# Patient Record
Sex: Female | Born: 1940 | Race: White | Hispanic: No | Smoking: Never smoker
Health system: Southern US, Community
[De-identification: ages and names within clinical notes are randomized; demographics above are authoritative.]

## PROBLEM LIST (undated history)

## (undated) DIAGNOSIS — C44509 Unspecified malignant neoplasm of skin of other part of trunk: Secondary | ICD-10-CM

## (undated) DIAGNOSIS — K635 Polyp of colon: Secondary | ICD-10-CM

## (undated) DIAGNOSIS — J986 Disorders of diaphragm: Secondary | ICD-10-CM

## (undated) DIAGNOSIS — J189 Pneumonia, unspecified organism: Secondary | ICD-10-CM

## (undated) DIAGNOSIS — E042 Nontoxic multinodular goiter: Secondary | ICD-10-CM

## (undated) DIAGNOSIS — K5790 Diverticulosis of intestine, part unspecified, without perforation or abscess without bleeding: Secondary | ICD-10-CM

## (undated) DIAGNOSIS — E785 Hyperlipidemia, unspecified: Secondary | ICD-10-CM

## (undated) DIAGNOSIS — I350 Nonrheumatic aortic (valve) stenosis: Secondary | ICD-10-CM

## (undated) DIAGNOSIS — N189 Chronic kidney disease, unspecified: Secondary | ICD-10-CM

## (undated) DIAGNOSIS — M6700 Short Achilles tendon (acquired), unspecified ankle: Secondary | ICD-10-CM

## (undated) DIAGNOSIS — K222 Esophageal obstruction: Secondary | ICD-10-CM

## (undated) DIAGNOSIS — I499 Cardiac arrhythmia, unspecified: Secondary | ICD-10-CM

## (undated) DIAGNOSIS — R002 Palpitations: Secondary | ICD-10-CM

## (undated) DIAGNOSIS — K219 Gastro-esophageal reflux disease without esophagitis: Secondary | ICD-10-CM

## (undated) DIAGNOSIS — M1990 Unspecified osteoarthritis, unspecified site: Secondary | ICD-10-CM

## (undated) DIAGNOSIS — K449 Diaphragmatic hernia without obstruction or gangrene: Secondary | ICD-10-CM

## (undated) DIAGNOSIS — I10 Essential (primary) hypertension: Secondary | ICD-10-CM

## (undated) DIAGNOSIS — K649 Unspecified hemorrhoids: Secondary | ICD-10-CM

## (undated) DIAGNOSIS — R0600 Dyspnea, unspecified: Secondary | ICD-10-CM

## (undated) DIAGNOSIS — R0609 Other forms of dyspnea: Secondary | ICD-10-CM

## (undated) HISTORY — PX: TOE AMPUTATION: SHX809

## (undated) HISTORY — PX: EYE SURGERY: SHX253

## (undated) HISTORY — DX: Unspecified hemorrhoids: K64.9

## (undated) HISTORY — DX: Other forms of dyspnea: R06.09

## (undated) HISTORY — PX: OTHER SURGICAL HISTORY: SHX169

## (undated) HISTORY — PX: TOTAL KNEE ARTHROPLASTY: SHX125

## (undated) HISTORY — PX: JOINT REPLACEMENT: SHX530

## (undated) HISTORY — PX: MASS EXCISION: SHX2000

## (undated) HISTORY — DX: Disorders of diaphragm: J98.6

## (undated) HISTORY — PX: BIOPSY THYROID: PRO38

## (undated) HISTORY — PX: BILATERAL OOPHORECTOMY: SHX1221

## (undated) HISTORY — DX: Diaphragmatic hernia without obstruction or gangrene: K44.9

## (undated) HISTORY — DX: Palpitations: R00.2

## (undated) HISTORY — DX: Morbid (severe) obesity due to excess calories: E66.01

## (undated) HISTORY — DX: Unspecified malignant neoplasm of skin of other part of trunk: C44.509

## (undated) HISTORY — DX: Gastro-esophageal reflux disease without esophagitis: K21.9

## (undated) HISTORY — PX: APPENDECTOMY: SHX54

## (undated) HISTORY — DX: Dyspnea, unspecified: R06.00

## (undated) HISTORY — DX: Unspecified osteoarthritis, unspecified site: M19.90

## (undated) HISTORY — PX: COLONOSCOPY: SHX174

## (undated) HISTORY — DX: Nonrheumatic aortic (valve) stenosis: I35.0

## (undated) HISTORY — DX: Short Achilles tendon (acquired), unspecified ankle: M67.00

## (undated) HISTORY — PX: FINGER SURGERY: SHX640

## (undated) HISTORY — DX: Essential (primary) hypertension: I10

## (undated) HISTORY — DX: Esophageal obstruction: K22.2

## (undated) HISTORY — DX: Hyperlipidemia, unspecified: E78.5

## (undated) HISTORY — PX: DILATION AND CURETTAGE OF UTERUS: SHX78

---

## 1999-01-03 ENCOUNTER — Ambulatory Visit (HOSPITAL_BASED_OUTPATIENT_CLINIC_OR_DEPARTMENT_OTHER): Admission: RE | Admit: 1999-01-03 | Discharge: 1999-01-03 | Payer: Self-pay | Admitting: Orthopedic Surgery

## 2000-05-12 ENCOUNTER — Other Ambulatory Visit: Admission: RE | Admit: 2000-05-12 | Discharge: 2000-05-12 | Payer: Self-pay | Admitting: Obstetrics and Gynecology

## 2000-05-12 ENCOUNTER — Encounter (INDEPENDENT_AMBULATORY_CARE_PROVIDER_SITE_OTHER): Payer: Self-pay | Admitting: Specialist

## 2000-06-22 ENCOUNTER — Encounter (INDEPENDENT_AMBULATORY_CARE_PROVIDER_SITE_OTHER): Payer: Self-pay | Admitting: Specialist

## 2000-06-22 ENCOUNTER — Ambulatory Visit (HOSPITAL_COMMUNITY): Admission: RE | Admit: 2000-06-22 | Discharge: 2000-06-22 | Payer: Self-pay | Admitting: Obstetrics and Gynecology

## 2000-06-24 ENCOUNTER — Inpatient Hospital Stay (HOSPITAL_COMMUNITY): Admission: AD | Admit: 2000-06-24 | Discharge: 2000-06-24 | Payer: Self-pay | Admitting: Obstetrics and Gynecology

## 2005-07-21 ENCOUNTER — Ambulatory Visit (HOSPITAL_COMMUNITY): Admission: RE | Admit: 2005-07-21 | Discharge: 2005-07-21 | Payer: Self-pay | Admitting: Internal Medicine

## 2005-07-22 ENCOUNTER — Inpatient Hospital Stay (HOSPITAL_COMMUNITY): Admission: RE | Admit: 2005-07-22 | Discharge: 2005-07-28 | Payer: Self-pay | Admitting: Specialist

## 2005-11-11 ENCOUNTER — Inpatient Hospital Stay (HOSPITAL_COMMUNITY): Admission: RE | Admit: 2005-11-11 | Discharge: 2005-11-15 | Payer: Self-pay | Admitting: Specialist

## 2006-02-02 ENCOUNTER — Ambulatory Visit: Payer: Self-pay | Admitting: Internal Medicine

## 2006-02-05 ENCOUNTER — Ambulatory Visit: Payer: Self-pay | Admitting: Internal Medicine

## 2006-02-05 ENCOUNTER — Encounter (INDEPENDENT_AMBULATORY_CARE_PROVIDER_SITE_OTHER): Payer: Self-pay | Admitting: Specialist

## 2007-09-23 ENCOUNTER — Encounter (INDEPENDENT_AMBULATORY_CARE_PROVIDER_SITE_OTHER): Payer: Self-pay | Admitting: Interventional Radiology

## 2007-09-23 ENCOUNTER — Encounter: Admission: RE | Admit: 2007-09-23 | Discharge: 2007-09-23 | Payer: Self-pay | Admitting: Internal Medicine

## 2007-09-23 ENCOUNTER — Other Ambulatory Visit: Admission: RE | Admit: 2007-09-23 | Discharge: 2007-09-23 | Payer: Self-pay | Admitting: Interventional Radiology

## 2007-10-18 ENCOUNTER — Ambulatory Visit: Payer: Self-pay | Admitting: Vascular Surgery

## 2007-10-20 ENCOUNTER — Encounter (HOSPITAL_BASED_OUTPATIENT_CLINIC_OR_DEPARTMENT_OTHER): Admission: RE | Admit: 2007-10-20 | Discharge: 2008-01-18 | Payer: Self-pay | Admitting: Surgery

## 2008-01-20 ENCOUNTER — Encounter (HOSPITAL_BASED_OUTPATIENT_CLINIC_OR_DEPARTMENT_OTHER): Admission: RE | Admit: 2008-01-20 | Discharge: 2008-02-07 | Payer: Self-pay | Admitting: Surgery

## 2008-03-13 ENCOUNTER — Inpatient Hospital Stay (HOSPITAL_COMMUNITY): Admission: AD | Admit: 2008-03-13 | Discharge: 2008-03-15 | Payer: Self-pay | Admitting: Orthopedic Surgery

## 2009-04-13 IMAGING — US US SOFT TISSUE HEAD/NECK
1 series · 13 of 25 positions shown · non-contrast
Comparison: NONE

CLINICAL DATA: Follow up nodules. Prior study 09/07/06. 

THYROID ULTRASOUND

[Series 1: us thyroid · 0.06mm/px · 13 of 34 slices shown]
[im 1/34]
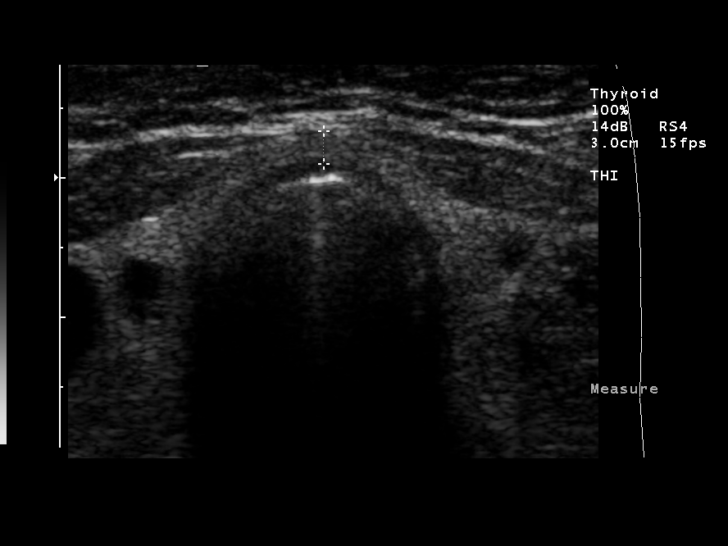
[im 3/34]
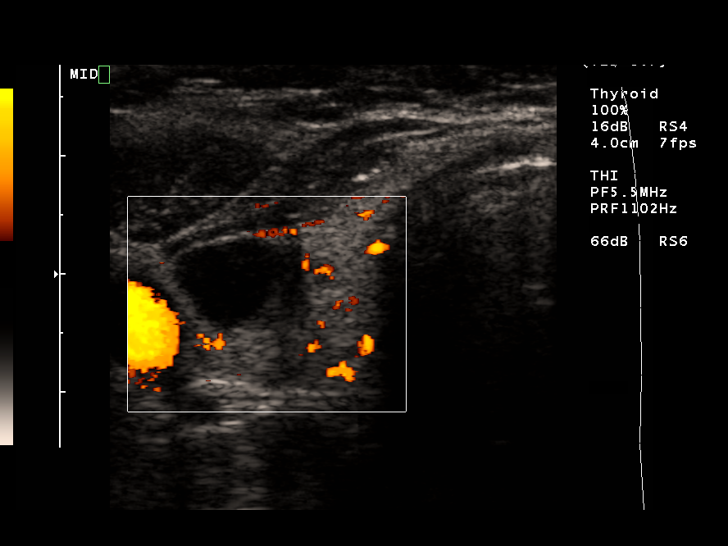
[im 6/34]
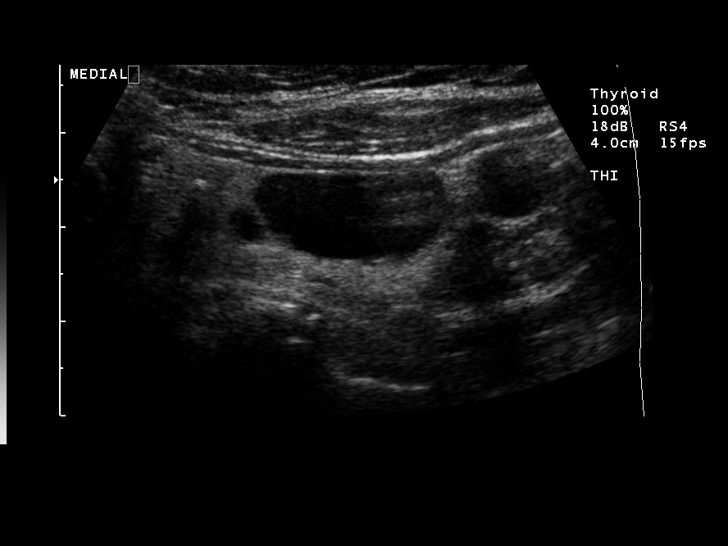
[im 9/34]
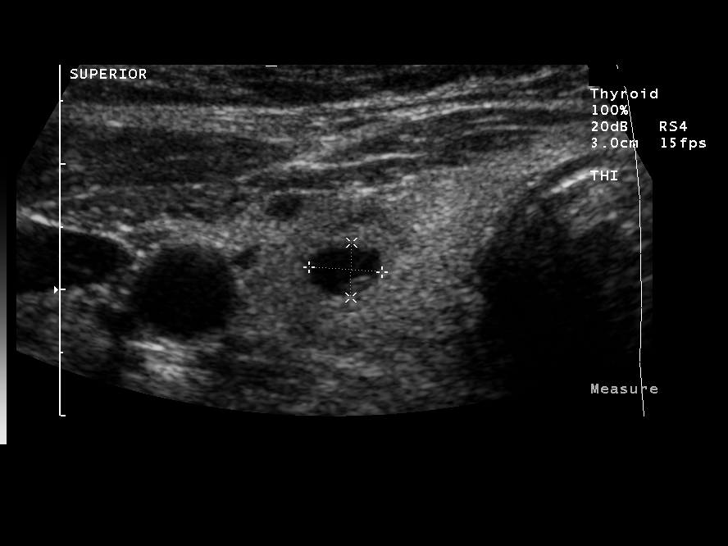
[im 12/34]
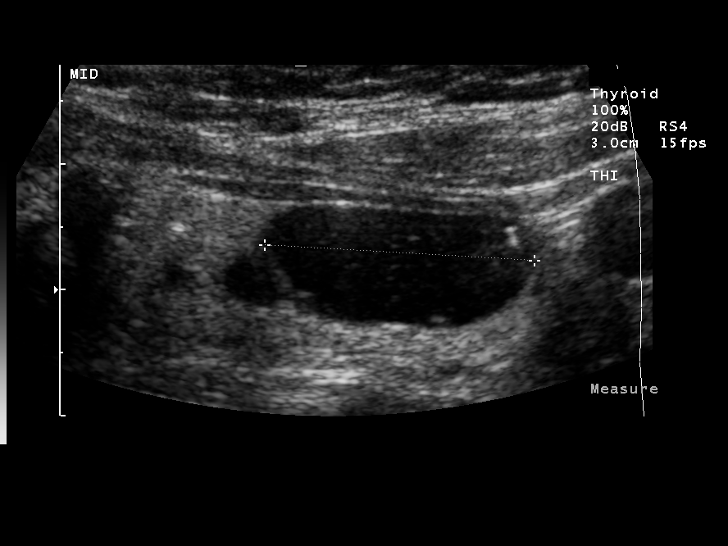
[im 14/34]
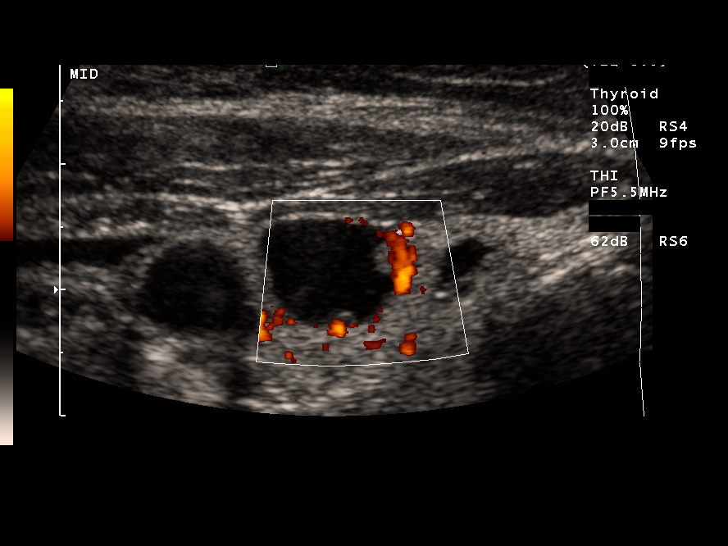
[im 17/34]
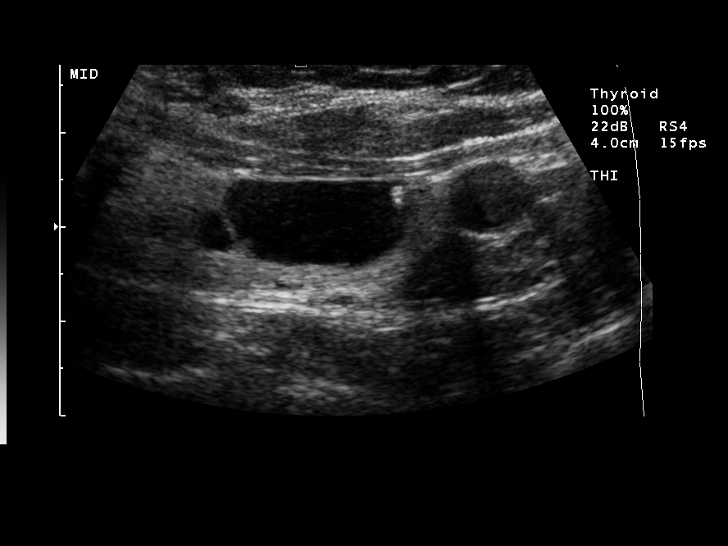
[im 20/34]
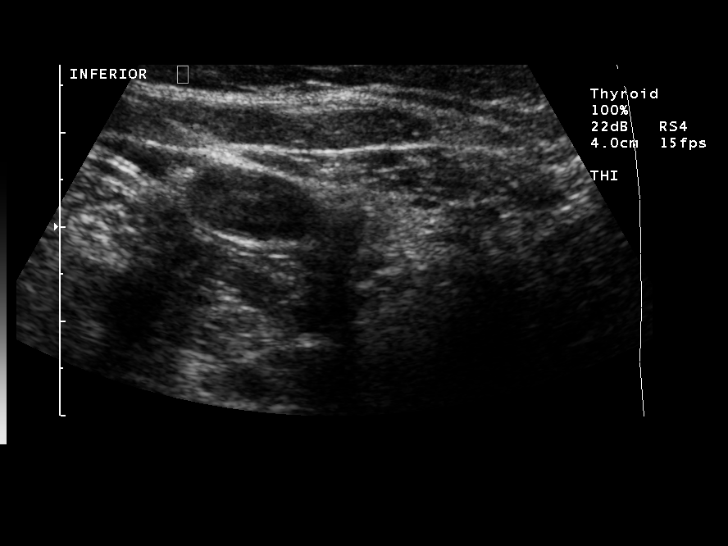
[im 23/34]
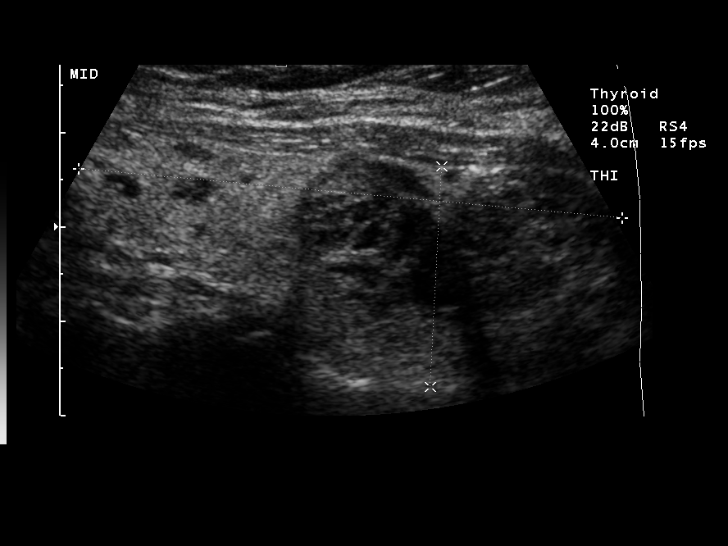
[im 25/34]
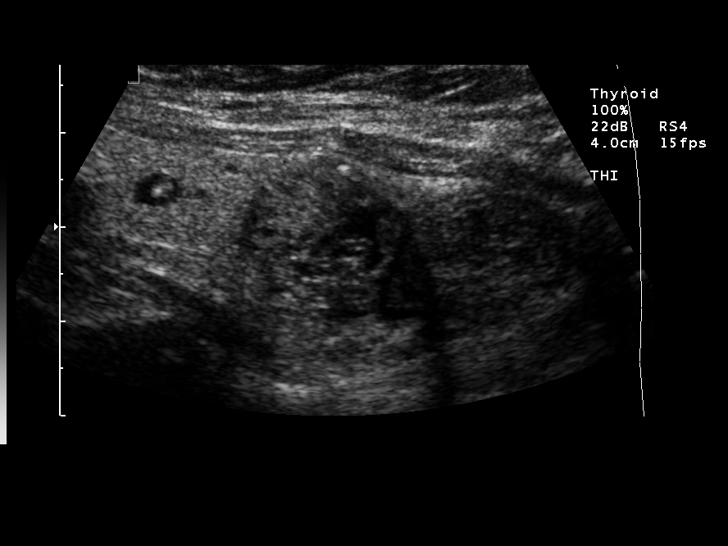
[im 28/34]
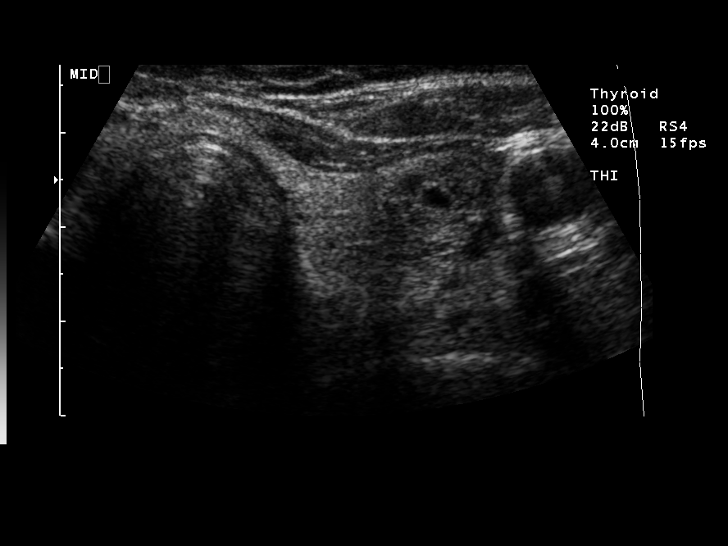
[im 31/34]
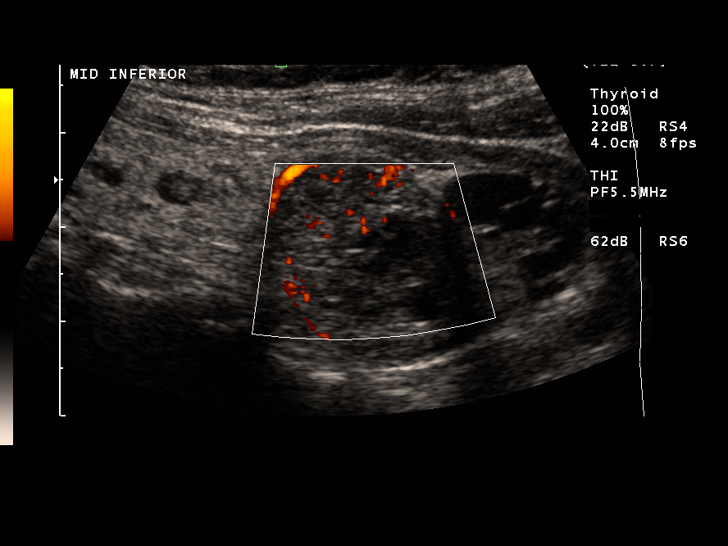
[im 34/34]
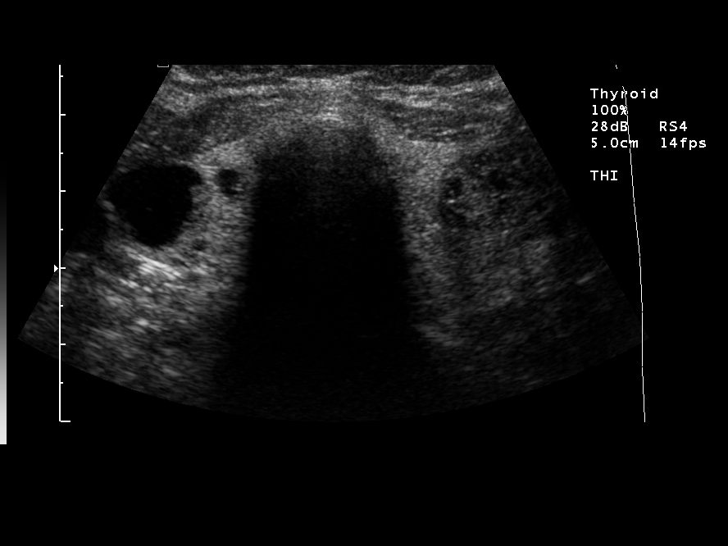

[13 of 25 positions shown; findings below may reference images not displayed]

FINDINGS RIGHT LOBE: 5.2 x 1.5 x 2.2 cm LEFT LOBE: 5.8 x 2.3 x 
1.9 cm ISTHMUS: 0.2 cm The thyroid gland is symmetrically 
enlarged. Examination of the right lobe demonstrates a 
predominantly cystic nodule measuring 2.1 x 1.1 x 1.5 cm. This is 
stable in size. Two small soft tissue components are now noted 
within this cyst, each measuring 0.3 cm. Focal rounded 
calcifications in the periphery suggest colloid cysts. 
Heterogeneous hypoechoic nodules noted in the upper lobe measure 
0.7 cm and 0.6 cm. These have not changed significantly. Located 
in the lower lobe, there is a small cyst measuring 0.8 cm. This is 
also stable. Evaluation of the left lobe again demonstrates a 
solid nodule at the junction of the mid and inferior portions of 
the lobe, measuring 2.7 x 2.3 x 2.1 cm. This is heterogeneous in 
appearance. Measurement is slightly greater when compared with the 
prior study; however, echo appearances are unchanged. An adjacent 
smaller, similar-appearing nodule is also noted measuring 2.2 x 
1.7 x 2.0 cm. This was present on the prior study and does not 
appear significantly changed. A 0.6-cm heterogeneous nodule is 
noted in the upper left lobe.
IMPRESSION: Bilateral thyroid nodules. The predominantly cystic 
structure in the right lobe now contains two small soft tissue 
components. The solid nodule on the left, although appearing 
similar in echo pattern, has increased slightly in size. I 
recommend correlation with any previous biopsy results. I would 
give consideration to biopsy of the two dominant nodules in each 
lobe versus ultrasound re-evaluation in 6 months. Torrence Tewari 
Wniu, M.D. electronically reviewed on 09/10/2007 Dict Date: 
09/09/2007  Tran Date: 09/10/2007 CAV  [REDACTED]

## 2010-01-15 ENCOUNTER — Encounter: Admission: RE | Admit: 2010-01-15 | Discharge: 2010-01-15 | Payer: Self-pay | Admitting: Internal Medicine

## 2010-12-26 ENCOUNTER — Encounter: Payer: Self-pay | Admitting: Cardiology

## 2011-01-01 ENCOUNTER — Ambulatory Visit: Payer: Self-pay | Admitting: Cardiology

## 2011-01-25 ENCOUNTER — Ambulatory Visit (HOSPITAL_COMMUNITY)
Admission: RE | Admit: 2011-01-25 | Discharge: 2011-01-25 | Disposition: A | Discharge status: Home / Self Care | Payer: Medicare Other | Source: Ambulatory Visit | Attending: Orthopedic Surgery | Admitting: Orthopedic Surgery

## 2011-01-25 DIAGNOSIS — Z01818 Encounter for other preprocedural examination: Secondary | ICD-10-CM | POA: Insufficient documentation

## 2011-01-25 DIAGNOSIS — M869 Osteomyelitis, unspecified: Secondary | ICD-10-CM | POA: Insufficient documentation

## 2011-01-25 DIAGNOSIS — Z01810 Encounter for preprocedural cardiovascular examination: Secondary | ICD-10-CM | POA: Insufficient documentation

## 2011-01-25 DIAGNOSIS — G609 Hereditary and idiopathic neuropathy, unspecified: Secondary | ICD-10-CM | POA: Insufficient documentation

## 2011-01-25 DIAGNOSIS — E119 Type 2 diabetes mellitus without complications: Secondary | ICD-10-CM | POA: Insufficient documentation

## 2011-01-25 DIAGNOSIS — Z01812 Encounter for preprocedural laboratory examination: Secondary | ICD-10-CM | POA: Insufficient documentation

## 2011-01-25 DIAGNOSIS — R9431 Abnormal electrocardiogram [ECG] [EKG]: Secondary | ICD-10-CM | POA: Insufficient documentation

## 2011-01-25 LAB — CBC
HCT: 39.6 % (ref 36.0–46.0)
Hemoglobin: 12.7 g/dL (ref 12.0–15.0)
MCH: 26.7 pg (ref 26.0–34.0)
MCHC: 32.1 g/dL (ref 30.0–36.0)
MCV: 83.4 fL (ref 78.0–100.0)
RBC: 4.75 MIL/uL (ref 3.87–5.11)
RDW: 14.1 % (ref 11.5–15.5)
WBC: 5 10*3/uL (ref 4.0–10.5)

## 2011-01-25 LAB — BASIC METABOLIC PANEL
BUN: 18 mg/dL (ref 6–23)
CO2: 31 mEq/L (ref 19–32)
Calcium: 9.5 mg/dL (ref 8.4–10.5)
Chloride: 103 mEq/L (ref 96–112)
Creatinine, Ser: 0.85 mg/dL (ref 0.4–1.2)
GFR calc Af Amer: >60 mL/min (ref >60)
Glucose, Bld: 130 mg/dL — ABNORMAL HIGH (ref 70–99)
Potassium: 3.9 mEq/L (ref 3.5–5.1)

## 2011-01-25 LAB — SURGICAL PCR SCREEN: Staphylococcus aureus: NEGATIVE

## 2011-01-25 LAB — GLUCOSE, CAPILLARY: Glucose-Capillary: 143 mg/dL — ABNORMAL HIGH (ref 70–99)

## 2011-02-09 NOTE — Op Note (Signed)
  NAMEGINNIFER, CREELMAN                ACCOUNT NO.:  [id number]  MEDICAL RECORD NO.:  [id number]           PATIENT TYPE:  LOCATION:                                 FACILITY:  PHYSICIAN:  Leonides Grills, M.D.     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  OPERATION:  Left 3rd toe amputation through MTP joint.  ANESTHESIA:  General.  SURGEON:  Leonides Grills, M.D.  ASSISTANT:  None.  ESTIMATED BLOOD LOSS:  Minimal.  TOURNIQUET TIME:  None.  COMPLICATIONS:  None.  DISPOSITION:  Stable to PR.  INDICATIONS:  This is a 70 year old female with diabetes mellitus and peripheral neuropathy.  She has had a redness and swelling involving her left great toe.  X-rays showed osteomyelitis of the middle phalanx.  She was consented for the above procedure.  All risks of infection, vessel injury, proximal amputation, possibility of deformity of her lesser toes, wound healing problems, DVT, PE were all explained.  Questions were encouraged and answered.  OPERATION:  The patient was brought to the operating room, placed in the supine position.  After adequate general endotracheal anesthesia was administered as well as vancomycin 500 mg IV piggyback.  The left lower extremity was then prepped and draped in the sterile manner.  No tourniquet was used.  A racquet-shaped incision based dorsally was then made.  Dissection was carried down directly to bone as a full-thickness flap.  Toe was then disarticulated at the MTP joint.  Area was copiously irrigated with normal saline.  Hemostasis was obtained.  Subcu was closed with 3-0 Vicryl.  Skin was closed with 4-0 nylon.  Sterile dressing was applied. Hard sole shoes applied.  The patient was stable to PR.     Leonides Grills, M.D.     PB/MEDQ  D:   01/25/2011  T:  01/26/2011  Job:  [id number]  Electronically Signed by Leonides Grills M.D. on 02/09/2011 09:20:27 AM

## 2011-02-18 NOTE — Assessment & Plan Note (Signed)
[facility]   NAME:  [name], [name]                ACCOUNT NO.:  [id number]   MEDICAL RECORD NO.:  [id number]           DATE OF BIRTH:   PHYSICIAN:  Theresia Majors. Tanda Rockers, M.D. VISIT DATE:  12/23/2007                                   OFFICE VISIT   SUBJECTIVE:  [name] is a 70 year old lady who we are following for a  Wagner 2 diabetic foot ulcer involving the left 3rd toe.  We have  treated the patient with Iodoform gel applied for 3-4 days.  She has had  her custom orthotics adjusted in the interim.  She continues to wear the  white sock as a dressing.  There has been no drainage, fever, or  malodor.   OBJECTIVE:  VITAL SIGNS:  Blood pressure 136/76, respirations 16, pulse  rate 69, temperature 98.3.  Inspection of the left 3rd digit shows that there is no contraction of  the wound.  A halo of callous was excised without difficulty.  There was  no bleeding stimulated.  The foot is warm, capillary refill was brisk.   ASSESSMENT:  Clinical improvement of Wagner grade 2 diabetic foot ulcer.   PLAN:  We will continue the Iodosorb gel and custom orthotics.  We will  re-evaluate her in 2 weeks with anticipation that this wound should be  healed.      Harold A. Tanda Rockers, M.D.  Electronically Signed     HAN/MEDQ  D:  12/23/2007  T:  12/23/2007  Job:  [id number]

## 2011-02-18 NOTE — Assessment & Plan Note (Signed)
[facility]   NAME:  [name], [name]                ACCOUNT NO.:  [id number]   MEDICAL RECORD NO.:  [id number]      DATE OF BIRTH:  Oct 29, 1940   PHYSICIAN:  Theresia Majors. Tanda Rockers, M.D. VISIT DATE:  12/09/2007                                   OFFICE VISIT   SUBJECTIVE:  [name] is a 70 year old lady whom we have followed for  Wagner 2 diabetic foot ulcer involving the third toe on the left foot.  In the interim, we have treated her with topical Iodosorb and  continuation of her offloading orthotic insert.  There has been no  excessive drainage, malodor, pain, or fever.   OBJECTIVE:  Blood pressure is 158/85, respirations 16, pulse rate 65,  temperature is 98.  Inspection of the lower extremity shows trace edema.  There are palpable  pulses bilaterally.  On the left third toe, there is inssipated  Iodosorb, which was removed.  There is a thick callus with subdermal  hemorrhage which was excised without difficulty.  The cornified  epithelium and a rim of subcutaneous tissue was removed with this  thickly inflamed callus.  The resulting hemorrhage was controlled with  silver nitrate.   ASSESSMENT:  Adequately debrided Wagner 2 diabetic foot ulcer, left  third toe.   PLAN:  We are requesting a reevaluation by the orthotist to establish  effective offloading of the left third toe.  We have instructed the  patient to continue the daily antiseptic soap washes and a topical  application of Iodosorb.  We will reevaluate the patient in 2 weeks  p.r.n.      Harold A. Tanda Rockers, M.D.  Electronically Signed     HAN/MEDQ  D:  12/09/2007  T:  12/09/2007  Job:  [id number]

## 2011-02-18 NOTE — Assessment & Plan Note (Signed)
[facility]   NAME:  [name], [name]                ACCOUNT NO.:  [id number]   MEDICAL RECORD NO.:  [id number]      DATE OF BIRTH:  1941-04-30   PHYSICIAN:  Theresia Majors. Tanda Rockers, M.D.      VISIT DATE:                                   OFFICE VISIT   SUBJECTIVE:  [name] is a 70 year old lady who we followed for Wagner I  ulcer of the left second toe.  In the interim, we have treated her with  topical  Iodosorb gel, being applied every 2-3 days, and she is  proceeding with orthotic fitting.  She has had inserts suggested at  least twice and each have been inadequate resulting an injury when she  attempted to use these continuously.  She returns for followup.   OBJECTIVE:  Blood pressure is 160/90, respirations 16, pulse rate 76,  temperature 98, capillary blood glucose is 112 mg percent.  Inspection  of left third toe shows that the ulcer has a halo of callus, but there  is no penetration into the deep subcutaneous tissue.  The granulation  appears healthy.  No debridement was needed.  The pedal pulse remains  faintly palpable.  There is a resolving hematoma on the medial aspect  and inferior to the medial malleolus, which the patient attributes to  the malfitting orthotics previously.  Both feet are warm and  symmetrical.  There is no evidence of ascending infection or ischemia.   ASSESSMENT:  [name] I diabetic foot ulcer secondary to inadequate  offloading.   PLAN:  We have encouraged the patient to continue to pursuit of her  custom orthotics for adequate offloading in the interim.  She will  continue to apply the topical Iodosorb and institute local care.  We  will reevaluate her in 2 weeks.  Hopefully, the orthotics fitting will  be completed at that time.      Harold A. Tanda Rockers, M.D.  Electronically Signed     HAN/MEDQ  D:  01/20/2008  T:  01/21/2008  Job:  [id number]

## 2011-02-18 NOTE — Assessment & Plan Note (Signed)
[facility]   NAME:  AVERILL, WINTERS                ACCOUNT NO.:  [id number]   MEDICAL RECORD NO.:  [id number]      DATE OF BIRTH:  [birth year]   PHYSICIAN:  Theresia Majors. Tanda Rockers, M.D. VISIT DATE:  11/11/2007                                   OFFICE VISIT   SUBJECTIVE:  Ms. [name] is a 70 year old lady with a Wagoner II diabetic  foot ulcer involving the left third toe.  In the interim, she has been  treated with antiseptic soap and topical Iodosorb, there has been no  excessive drainage, malodor, pain or fever.   OBJECTIVE:  Blood pressure is 175/84, respirations 16, pulse rate 68,  temperature 98.7.  Capillary blood glucose is 130 mg%.  Inspection of  the toe shows that there is impacted Iodosorb which, after removing,  discloses healthy-appearing granulation, there is no evidence of  infection, the pedal pulse is palpable, there is 1+ edema, there is no  evidence of ascending infection.   ASSESSMENT:  Satisfactory response to Iodosorb and local care.   PLAN:  We will continue the patient on the antiseptic soap, wash and  Iodosorb with off-loading to be provided by her podiatrist.  We will re-  evaluate patient in 2 weeks p.r.n.      Harold A. Tanda Rockers, M.D.  Electronically Signed     HAN/MEDQ  D:  11/11/2007  T:  11/12/2007  Job:  [id number]

## 2011-02-18 NOTE — Assessment & Plan Note (Signed)
[facility]   NAME:  ROMEY, COHEA                ACCOUNT NO.:  [id number]   MEDICAL RECORD NO.:  [id number]      DATE OF BIRTH:  December 22, 1940   PHYSICIAN:  Theresia Majors. Tanda Rockers, M.D. VISIT DATE:  11/25/2007                                   OFFICE VISIT   SUBJECTIVE:  Ms. Vanstone is a 70 year old female whom we are treating for  Wagner grade 2 diabetic foot ulcer involving the third toe of the left  foot.  In the interim she has placed Iodosorb gel and continued local  care.  She has procured and is wearing custom orthotics.   OBJECTIVE:  Blood pressure is 117/77, respirations 18, pulse rate 68,  temperature 97.7, capillary blood glucose is 105 mg percent.  Inspection of the left lower extremity shows the persistence of 2+  edema.  The wound itself is 99% re-epithelialized.  There is recurrence  of moderate amount of callus which was pared without difficulty.  There  is no evidence of ascending infection.  The pedal pulse remains readily  palpable in spite of the edema.   ASSESSMENT:  Clinical improvement of the Wagner wound.   PLAN:  We will continue her with Iodosorb gel every 2-3 days.  Will also  encouraged her to wear her custom orthotics.  We will reevaluate her in  2 weeks.      Harold A. Tanda Rockers, M.D.  Electronically Signed     HAN/MEDQ  D:  11/25/2007  T:  11/26/2007  Job:  [id number]

## 2011-02-18 NOTE — Op Note (Signed)
NAME:  [name], [name]                ACCOUNT NO.:  [id number]   MEDICAL RECORD NO.:  [id number]          PATIENT TYPE:  OIB   LOCATION:  5023                         FACILITY:  [facility]   PHYSICIAN:  Almedia Balls. Ranell Patrick, M.D. DATE OF BIRTH:  1941-09-17   DATE OF PROCEDURE:  03/13/2008  DATE OF DISCHARGE:                               OPERATIVE REPORT   PREOPERATIVE DIAGNOSIS:  Left shoulder end-stage osteoarthritis.   POSTOPERATIVE DIAGNOSIS:  Left shoulder end-stage osteoarthritis.   PROCEDURE PERFORMED:  Left total shoulder replacement using [employer]  System with a 44 x 21 eccentric head, an 8 stem, and a 40 glenoid.   SURGEON:  Almedia Balls. Ranell Patrick, MD   ASSISTANT:  Donnie Coffin. Dixon, PA-C   ANESTHESIA:  General anesthesia plus interscalene block anesthesia was  used.   ESTIMATED BLOOD LOSS:  100 mL.   FLUIDS REPLACEMENT:  1800 mL of crystalloid.   URINE OUTPUT:  350 milliliters.   INSTRUMENT COUNTS:  Correct.   COMPLICATIONS:  None.   Perioperative antibiotics were given.   INDICATIONS:  [name] is a 70 year old female with end-stage arthritis  to her left shoulder.  The patient has debilitating pain and functional  loss secondary to her arthritis.  She has failed conservative management  consisting of injections, activity modification, anti-inflammatories,  and presents now for operative treatment to restore function and  eliminate pain.  Informed consent was obtained.   DESCRIPTION OF PROCEDURE:  After an adequate level of anesthesia was  achieved, the patient was positioned supine on the operating room table.  The patient's left shoulder was examined under anesthesia.  The patient  had a portal elevation at about 120 degrees, external rotation at about  30, and internal rotation to her abdomen.  After sterile prep and drape  of the left shoulder, we began shoulder arthroscopy into the shoulder.  Using standard deltopectoral approach, we started with the coracoid  extending this with the humerus.  Dissection was carried sharply down  through to the subcutaneous tissues.  Deltopectoral interval was  identified.  Cephalic vein taken laterally with the deltoid.  Conjoined  tendon was taken medially.  Subscapularis was taken off the left lesser  tuberosity and 3 Mason-Allen sutures and #2 FiberWire placed.  We freed  the subscapular flap from the capsule and the subcoracoid tissue.  Next,  we identified the humeral head and progressed with external rotation and  released the soft tissue which allowed [place] to go and perform neck-cut  osteotomy with the neck cutting guide.  All marginal osteophytes were  removed.  Next, we prepared the humeral shaft using sequential reamers.  We were able to ream up to a size 8.  Attempts at reaming a 10 could  only get halfway down.  We encountered hard bone.  We decided not to try  to progressing further due to risk of fracture at this point.  We then  placed in our 8-broach and we had appropriate retroversion on that cut,  which was approximately 10-15 degrees.  At this point with the broach  in, we went ahead and  retracted the humerus posteriorly gaining 360-  degree visualization of the glenoid.  There was full-thickness wear and  erosions and cysts.  We removed the cystic material using a small  curette, removed the glenoid labrum, marked 12 o'clock, 6 o'clock, 3  o'clock and 9 o'clock positions, drilled a central hole, sized the  glenoid to a size initially 44, but with the soft tissue and marginal  osteophyte removed it went down to size 40.  We reamed for a 40, and  then went ahead and made a decision to place a keel at the glenoid  rather than an anchoring peg secondary to fairly thin bone down near the  anterior-inferior glenoid anteriorly.  At this point, we went ahead and  drilled two superior and inferior holes for the keel of glenoid, using  the keel punch and punched with a 40 keel.  We then placed a 40  trial  and then placed a 44 x 18 eccentric glenoid.  This was little loose with  the ability to fully displace posteriorly, that went with a 44 x 21  eccentric and that gave [place] perfect coverage as well as excellent soft  tissue balance.  We were able to translate 50% anteriorly and 50%  posteriorly.  Once these sizes fit perfectly, we went ahead and removed  the trial components.  We then, using a small drill hole, made the drill  holes in the anterior humerus at the lesser tuberosity and placed #2  FiberWire sutures in a mattress fashion with 4 suture coming up through  the lesser tuberosity for subscapular repair.  We then cemented the 8-  stem in place and allowed that cement to harden and then retracted the  humerus posteriorly and cemented our 40-glenoid in place.  Once that was  allowed to harden, we trailed again with 18 and 21, the 21 gave [place]  perfect soft tissue balance.  Thus, we placed a 44 x 21 eccentric head  and then impacted that with the eccentric portion and rotated  posterosuperiorly.  Again, we removed excess osteophytes off the humerus  posteriorly prior to doing that and then we went ahead after thorough  irrigation, repaired the subscapularis anatomically using Mason-Allen  suture technique with FiberWire and oversewing the rotator interval with  the #2 FiberWire figure-of-eight and we also attained these to biceps as  we completed our subscapular repair.  Thorough irrigation was performed.  Deltopectoral interval closure with 0-Vicryl suture followed by 2-0  Vicryl subcutaneous closure and 4-0 Monocryl for the skin.  Steri-Strips  applied.      Almedia Balls. Ranell Patrick, M.D.  Electronically Signed     SRN/MEDQ  D:  03/13/2008  T:  03/14/2008  Job:  [id number]

## 2011-02-18 NOTE — Assessment & Plan Note (Signed)
[facility]   NAME:  [name], [name]               ACCOUNT NO.:  [id number]   MEDICAL RECORD NO.:  [id number]           DATE OF BIRTH:   PHYSICIAN:  Theresia Majors. Tanda Rockers, M.D. VISIT DATE:  01/06/2008                                   OFFICE VISIT   SUBJECTIVE:  [name] is a 70 year old lady who we are following for  Wagner 2 diabetic foot ulcer.  We have had continuing difficulty with  adequate offloading.  In the interim we have treated her with topical  Iodosorb.  She reports a scant drainage with no pain.  She has  appointments to be seen by the orthotist for adjustment next week.   OBJECTIVE:  Blood pressure is 152/93, respirations 18, pulse rate 74,  temperature 98.3.  Capillary blood glucose is 128 mg percent.  Inspection of the left third toe shows that there is inspissated,  impacted Iodosorb.  The callus is rolled up, creating a crevice.  Using  the fine forceps, the impacted Iodosorb was removed.  The redundant  callus was shaved off, disclosing a healthy-appearing granulating base.  The overgrown nail was trimmed and the superior portion of callus was  similarly removed.  There is no evidence of infection.  The pedal pulses  were 3+ and bounding   ASSESSMENT:  Wagner grade 2 diabetic foot ulcer with associated callus,  inadequate offloading.   PLAN:  We have encouraged the patient to keep her appointment with the  orthotist for adjustment of her insert.  We will reevaluate her in 2  weeks.  In the interim she will continue to use antiseptic soap wash,  topical Iodosorb every 2-3 days and wear her orthotics.      Harold A. Tanda Rockers, M.D.  Electronically Signed     HAN/MEDQ  D:  01/06/2008  T:  01/06/2008  Job:  [id number]

## 2011-02-18 NOTE — Discharge Summary (Signed)
[name], [name]                ACCOUNT NO.:  [id number]   MEDICAL RECORD NO.:  [id number]          PATIENT TYPE:  INP   LOCATION:  5023                         FACILITY:  [facility]   PHYSICIAN:  Almedia Balls. Ranell Patrick, M.D. DATE OF BIRTH:  [birth year]   DATE OF ADMISSION:  03/13/2008  DATE OF DISCHARGE:  03/14/2008                               DISCHARGE SUMMARY   ADMISSION DIAGNOSES:  Left shoulder pain secondary to end-stage  osteoarthritis.   DISCHARGE DIAGNOSES:  Left shoulder pain  secondary to end-stage  osteoarthritis.   BRIEF HISTORY:  The patient is a 70 year old female with worsening left  shoulder pain with range of motion and activity.  The patient elected to  have a total shoulder arthroplasty by Dr. Malon Kindle.   PROCEDURE:  The patient elected to have a left total shoulder  arthroplasty by Dr. Malon Kindle on March 13, 2008.   ASSISTANT:  Standley Dakins PA-C.   No complications.   ESTIMATED BLOOD LOSS:  Minimal.   HOSPITAL COURSE:  The patient was admitted on March 13, 2008 for the above-  stated procedure, which she tolerated well.  After adequate time in the  postanesthesia care unit she was transferred up to 5000.  On postop day  #1, the patient complained of some moderate soreness status post  surgery, but this to be expected.  Neurologically, she was intact,  resting comfortably with a sling in place.  The patient also states she  had a little difficulty breathing during the night after the surgery.  After breathing treatments, her tightness in her chest did seem to  relax.  The patient states that she did have a history of asthma and  this did feel like mild exacerbation greatly improved after the  breathing treatment.  Currently, she is not tachypneic.  No wheezes  heard.  Neurovascularly, she is intact at the left upper extremity.   DISCHARGE PLAN:  The patient will be discharged to home on March 14, 2008.  Activity is no use of the left upper extremity until  follow back up with  Dr. Malon Kindle.  The patient will follow back up with Dr. Malon Kindle in 2 weeks.   ALLERGIES:  The patient has allergies to PENICILLIN, SULFA, LATEX,  TERRAMYCIN, ROBAXIN, DILAUDID, STREPTOMYCIN, NEURONTIN, LYRICA, and  CEPHALOSPORINS.   DISCHARGE MEDICATIONS:  1. Aspirin 81 mg p.o. daily.  2. Coreg 12.5 mg b.i.d.  3. Lasix 20 mg p.o. daily.  4. Hydrochlorothiazide 25 mg p.o. daily.  5. Insulin NovoLog 11 units subcu t.i.d.  6. Glucophage 500 mg p.o. b.i.d.  7. Benicar 40 mg p.o. daily.  8. Protonix 80 mg p.o. daily.  9. Ranexa 1000 mg p.o. daily.  10.Zocor 20 mg p.o. nightly.  11.Albuterol 2 puffs inhaled q.4-6 h. p.r.n.  12.Valium 5 mg p.o. q. 6.  13.Flonase 2 sprays each nostril daily.   Her condition is stable.   Diet is low sodium.      Thomas B. Durwin Nora, P.A.      Almedia Balls. Ranell Patrick, M.D.  Electronically Signed    TBD/MEDQ  D:  03/14/2008  T:  03/14/2008  Job:  [id number]

## 2011-02-18 NOTE — Assessment & Plan Note (Signed)
[facility]   NAME:  [name], [name]                ACCOUNT NO.:  [id number]   MEDICAL RECORD NO.:  [id number]      DATE OF BIRTH:  Jun 14, 1941   PHYSICIAN:  Theresia Majors. Tanda Rockers, M.D.      VISIT DATE:                                   OFFICE VISIT   SUBJECTIVE:  [name] is a 70 year old female who we have followed for  Wagner I diabetic foot ulcer involving the left third digit.  In the  interim, she has had offloading utilizing a custom orthotic insert.  She  has had multiple readjustments and in fact is scheduled to have an  adjustment in the coming week.  In the interim, she has continued to use  Iodosorb on the open wound and exercise, precaution with changing of her  shoes, with care not to omit wearing the orthotics.  There has been no  drainage.  There has been no pain, fever, or redness.   OBJECTIVE:  VITAL SIGNS:  Blood pressure is 140/86, respirations 18,  pulse rate 78, and capillary blood glucose 101 mg percent.  She is  accompanied by her husband.  EXTREMITIES:  Inspection of the left third toe shows that there is  minimum callus.  There is complete re-epithelialization of the ulcer.  There is no evidence of hyperemia, drainage, or malodor.  The pedal  pulses readily palpable.   ASSESSMENT:  Resolved Wagner I diabetic foot ulcer.   PLAN:  We are discharging the patient from active care in the [facility].  We have instructed her to return to the primary care, Dr. Rodrigo Ran.  We have given the patient opportunity to ask questions.  She  seems to understand the clinical impression and our recommendation that  she be discharged from active care in the [facility].  She expresses  gratitude for having been seen in the clinic.      Harold A. Tanda Rockers, M.D.  Electronically Signed     HAN/MEDQ  D:  02/03/2008  T:  02/04/2008  Job:  [id number]   cc:   Loraine Leriche A. Perini, M.D.

## 2011-02-18 NOTE — Assessment & Plan Note (Signed)
[facility]   NAME:  [name], [name]                ACCOUNT NO.:  [id number]   MEDICAL RECORD NO.:  [id number]      DATE OF BIRTH:  1941-04-11   PHYSICIAN:  Theresia Majors. Tanda Rockers, M.D. VISIT DATE:  11/04/2007                                   OFFICE VISIT   SUBJECTIVE:  Ms. Sublette is a 70 year old lady who we have followed for  Wagner grade 2 diabetic foot ulcer involving the left foot and third  toe.  In the interim we have treated her with an offloading healing  sandal with antiseptic soap wash and topical iodosorb.  She continues to  be ambulatory.  There has been no excessive drainage, malodor pain or  fever.   OBJECTIVE:  VITALS:  Blood pressure is 163/70, respirations 18, pulse  rate 75, temperature 97.9, capillary blood glucose is 120 mg percent.  She is accompanied by husband.  Inspection of the left foot, third toe  shows that there is impacted our sore over a thickened callus. The  callus was debrided disclosing a near healed ulceration which now  measures approximately 1 mm in diameter.  There is no evidence of  ascending infection.  The pedal pulse remains palpable.  The excisional  debridement included the skin and callus.  The patient tolerated the  procedure well.   PLAN:  We will return the patient to her iodosorb gel with offloading.  She is awaiting her custom orthotics.  In addition, she has been  referred to a local podiatrist of choice to maintain her nail hygiene.  We will reevaluate the patient in 1 week in anticipation that she will  be completely resolved.      Harold A. Tanda Rockers, M.D.  Electronically Signed     HAN/MEDQ  D:  11/04/2007  T:  11/05/2007  Job:  [id number]

## 2011-02-18 NOTE — Assessment & Plan Note (Signed)
[facility]   NAME:  FUJIE, DICKISON                ACCOUNT NO.:  [id number]   MEDICAL RECORD NO.:  [id number]      DATE OF BIRTH:  1941/04/26   PHYSICIAN:  Theresia Majors. Tanda Rockers, M.D. VISIT DATE:  10/21/2007                                   OFFICE VISIT   REASON FOR CONSULTATION:  Ms. Nunley is a 70 year old female who is  referred by Dr. Rodrigo Ran for evaluation of a diabetic foot ulcer  involving the left third toe.   IMPRESSION:  Wagner grade 2 diabetic foot ulcer involving the left third  toe.   RECOMMENDATIONS:  Proceed with topical application of Iodosorb while  continuing the daily antiseptic soap wash.  We have given the patient a  prescription for custom orthotics to provide for offloading of the third  toe.   SUBJECTIVE:  Ms. Rasmusson is a 69 year old diabetic who has in the past worn  custom orthotics and inserts.  She has had previous breakdowns over the  toes and feet in the past, but these have responded to local care. The  current wound that involves the left third toe has been present for  approximately 2 weeks. This has been associated with a moderate amount  of bloody drainage.  There has been no fever.  The patient continues to  be ambulatory and there has been no malodor.  She is treated it only  with topical antibiotics and antiseptic soap.   PAST MEDICAL HISTORY:  IS REMARKABLE FOR MULTIPLE DRUG ALLERGIES  INCLUDING LATEX, PENICILLIN, SULFUR, STREPTOMYCIN, E-MYCIN, TERRAMYCIN,  CECLOR, VICODIN,  OXYCODONE, HYDROCODONE, DILAUDID, ROBAXIN, NEURONTIN  AND LYRICA.   CURRENT MEDICATION:  1. List includes Actonel 35 mg once a week.  2. Nexium 40 mg a day.  3. Furosemide 20 mg a day.  4. Darvocet N 100 one every 6 hours p.r.n.  5. Diovan/hydrochlorothiazide 320/25 one a day.  6. Metformin 500 mg 2 tablets a day.  7. Albuterol spray every 4 hours.  8. Simvastatin 20 mg daily.  9. Aspirin 81 mg a day.  10.Potassium 20 mEq two a day.  11.Coreg 40  mg a day.  12.Ranexa 500 mg a day.  13.Co-Q10 100 mg one a day.  14.Calcium D3  as recommended.  15.Multi-omega.  16.Alpha lipoic acid.  17.Daily vitamins.   PAST SURGERY:  1. Has included an appendectomy.  2. Bilateral knee replacements.  3. A lipoma excision.  4. D&C.  5. Laparoscopic oophorectomy.   FAMILY HISTORY:  Is positive for hypertension, stroke, heart attack and  cancer.   SOCIAL HISTORY:  Socially she is married.  She has adult children who  live remotely. She and her husband live in [place]. She is  retired.   REVIEW OF SYSTEMS:  The patient has never smoked.  She does not have a  chronic cough.  She denies transient visual changes or TIA like  symptoms.  Her weight has been stable.  She has had no polydipsia,  polyphagia.  She has recently had an aspiration of multiple nodules from  her thyroid.  There is no exercise intolerance.  She is able to walk  several blocks without difficulty.  She is able to negotiate stairs.  There are no bowel or bladder complaints.  The remainder review of  systems is negative.   PHYSICAL EXAM:  She is an alert, oriented female in no acute distress.  She is accompanied by her husband. She is in good contact with reality,  well oriented and responding appropriately to inquiry. Blood pressure is  150/77, respirations 18, pulse rate 72, temperature 98.4, capillary  blood glucose 121 mg percent.  HEENT:  Exam is clear.  The neck is supple.  Trachea is midline.  Thyroid is nonpalpable.  LUNGS:  Clear.  Heart sounds are distant.  Abdomen is soft.  There are bilateral 3+ dorsalis pedis pulses.  The patient is hirsuti. She is anesthetic to the signs wine stain  filament bilaterally in the halluces.  There is an area of callus,  blister formation and serous drainage involving the left third toe.  This area was debrided using a sharp technique with scissors and  forceps. The callus represented macerated blister which was  excised  disclosing a re-epithelialization surface.  There is no extension into  the subcutaneous tissue and definitely no exposed bone or tracts sinus.  There is associated bilateral 2+ edema with mild changes of stasis.   DISCUSSION:  The patient' ulcer represents a near-resolved Wagner II  diabetic foot ulcer.  We have discussed the necessity for adequate  offloading in the face of an insensate hallux.  The patient admits that  she had been prescribed orthotics in the past, but simply refused to  wear them. We have reemphasized the necessity of readdressing the need  for orthotics.  We provided her with a prescription to [facility] for  insert fitting.  We have given the patient and her husband an  opportunity to ask questions regarding the clinical impression and  recommendations.  They indicate that they understand the recommendations  and will be compliant.  They express gratitude for having been seen in  clinic.  We will reevaluate the patient in 2 weeks at which time we  anticipate that she would have procured her custom orthotics.      Harold A. Tanda Rockers, M.D.  Electronically Signed     HAN/MEDQ  D:  10/21/2007  T:  10/21/2007  Job:  [id number]   cc:   Loraine Leriche A. Perini, M.D.

## 2011-02-21 NOTE — Consult Note (Signed)
[facility]  Patient:    [name], [name]                       MRN: [id number] Adm. Date:  [id number] Attending:  Leonard Schwartz                          Consultation Report  HISTORY OF PRESENT ILLNESS:   [name] is a 70 year old female who had a diagnostic laparoscopy, laparoscopic bilateral oophorectomy, hysteroscopy, and D&C on June 22, 2000.   The patient was found to have endometrial polyps, and she was found to have a fibroid on the uterus.  The fibroid was not removed.  The patient presents to the emergency department tonight complaining of a fever of 102 on June 23, 2000, and a temperature of 100.4 on June 24, 2000.  She also complains of "pelvic pressure" like she just had a baby.  She is having normal bowel movements.  She said that it hurts when she passes her urine.  She denies nausea and vomiting.  She is tolerating a regular diet.  PHYSICAL EXAMINATION:  VITAL SIGNS:                  Temperature is 98, blood pressure is 181/83, 170/84, 163/84.  Pulse is 77, respirations 18.  HEENT:                        Within normal limits.  CHEST:                        Clear.  HEART:                        Regular rate and rhythm.  ABDOMEN:                      Soft, and bowel sounds are normal.  The incisions are well-healed.  No masses are appreciated.  There is no guarding or rebound.  BACK:                         No CVA tenderness.  EXTREMITIES:                  Within normal limits.  PELVIC:                       External genitalia is normal.  The vagina is normal except for relaxation.  Cervix is nontender.  Uterus is upper limits of normal size and only slightly tender.  Adnexa:  No masses.  Slightly tender.  LABORATORY DATA:              Hemoglobin is 12.3, white blood cell count is 10,000, platelet count is 212,000.  Urinalysis is negative.  ASSESSMENT:                   Pelvic pressure following  diagnostic laparoscopy, but I believe it is related to postoperative discomfort from normal surgery.  PLAN:                         The patient has multiple drug allergies, including nonsteroidal anti-inflammatory agents.  We did not give the patient Toradol, which is my routine.  We will give  the patient 10 mg of IM morphine. The patient reports that she has not been taking the Darvocet-N 100 that I gave her at home.  She was encouraged to take her medications.  She will return for follow-up in two to three weeks.  She will call sooner should she have any questions or concerns.  The patient will call her family physician should she again have an increase in her temperature.  The patient reported that even prior to her surgery, she had temperature elevation, and she wondered if she had a viral syndrome. DD:  06/24/00 TD:  06/25/00 Job: [id number]

## 2011-02-21 NOTE — Consult Note (Signed)
NAME:  [name], [name]                ACCOUNT NO.:  [id number]   MEDICAL RECORD NO.:  [id number]          PATIENT TYPE:  INP   LOCATION:  1516                         FACILITY:  [facility]   PHYSICIAN:  Mark A. Perini, M.D.   DATE OF BIRTH:  September 30, 1941   DATE OF CONSULTATION:  11/11/2005  DATE OF DISCHARGE:                                   CONSULTATION   REQUESTING PHYSICIAN:  Erasmo Leventhal, M.D.   REASON FOR CONSULTATION:  Hypertension and diabetes management  postoperatively.   HISTORY OF PRESENT ILLNESS:  [name] is a pleasant 70 year old female with a  past history significant for multiple medicine intolerances, type 2  diabetes, hypertension, which is longstanding and does have a significant  lability component, and peripheral neuropathy as well as some obesity and  generalized osteoarthritis.  She underwent a left total knee arthroplasty in  October, 2006, and today she underwent a right total knee arthroplasty.   PAST MEDICAL HISTORY:  1.  Type 2 diabetes.  2.  Hypertension.  3.  Peripheral neuropathy.  4.  Obesity.  5.  Osteoarthritis.  6.  Pneumonia as a child.  7.  Reactive airways.  8.  Appendectomy.  9.  Left total knee arthroplasty in October, 2006.  10. Bilateral salpingo-oophorectomy.  11. 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  12. Lumbar spinal stenosis.  13. Colon polyps.  14. 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  15. Restless legs syndrome.   ALLERGIES/INTOLERANCES:  1.  PENICILLIN causes shortness of breath.  2.  SULFA causes a rash.  3.  LATEX causes shortness of breath.  4.  MYCINS cause a rash in the mouth.  5.  CECLOR causes a rash.  6.  VICODIN causes hives.  7.  NEURONTIN causes leg swelling.  8.  PRILOSEC causes diarrhea.  9.  OXYCODONE causes dizziness, as does HYDROCODONE.  10. DILAUDID caused hives.  11. LYRICA caused leg swelling.  12. ACTONEL made her sick, but she has  been able to tolerate this with      subsequent dosing.   HOME MEDICATIONS:  1.  Protonix 40 mg daily.  2.  Lasix 40 mg occasionally.  3.  Darvocet as needed.  4.  Diovan/HCTZ 320/25 1 daily.  5.  Metformin 1000 mg twice daily.  6.  Albuterol as needed.  7.  Mobic 7.5 mg twice daily.  8.  Anodyne therapy for her legs.  9.  Nasacort AQ daily.  10. Aspirin 81 mg daily.  11. Actonel 35 mg weekly.  12. Atenolol 50 mg twice daily.  13. Potassium chloride 20 mEq daily.  14. Calcium with D.   SOCIAL HISTORY:  She is married.  Her husband, Molly Maduro, is at her bedside.  She has three living sons and one daughter.  She has 12 years of education.  She was an EMT.  No tobacco, no drug use, no alcohol.   FAMILY HISTORY:  Father died at age 76 of Alzheimer's and had a fracture.  Mother died at age  81 of sepsis.  There is a family history of diabetes and  thyroid cancer.   REVIEW OF SYSTEMS:  Currently, the patient has had some itching and was  given Benadryl, otherwise she has some right knee pain postoperatively.  No  other symptoms noted.   PHYSICAL EXAMINATION:  VITAL SIGNS:  Temperature 96.9, pulse 74, respiratory  rate 16, blood sugar 171, blood pressure 166/87, 94% saturation on 2 liters  of oxygen.  GENERAL:  She is somnolent but arousable.  LUNGS:  Clear to auscultation bilaterally, anterolaterally.  HEART:  Regular rate and rhythm with a 1/6 murmur at the left sternal border  and systole.  ABDOMEN:  Soft and nontender.  There is trace bilateral lower extremity  edema.   Laboratory data from November 06, 2004, a CMET, CBC, and coags were all  normal.   EKG on November 07, 2005 shows normal sinus rhythm with PVCs occasionally and  left axis deviation but is otherwise normal.   ASSESSMENT/PLAN:  1.  Postoperative from a right total knee arthroplasty:  Postop management      per surgery.  2.  Hypertension:  We will follow with you.  We could give p.r.n. clonidine      if needed  but for now, we will keep her on atenolol 50 mg twice daily      and her Diovan/hydrochlorothiazide.  3.  Will follow postoperative labs, including hemoglobin.  4.  Type 2 diabetes.  Her Metformin is on hold currently.  She will be      placed on Lantus and NovoLog sliding scale.  Her sugars were quite      manageable with this during her last hospital stay.   I appreciate this consultation.           ______________________________  Redge Gainer Waynard Edwards, M.D.     MAP/MEDQ  D:  11/11/2005  T:  11/11/2005  Job:  [id number]

## 2011-02-21 NOTE — Discharge Summary (Signed)
[name], [name]                ACCOUNT NO.:  [id number]   MEDICAL RECORD NO.:  [id number]          PATIENT TYPE:  INP   LOCATION:  1515                         [facility]ACILITY[facility]   PHYSICIAN:  Erasmo Leventhal, M.D.DATE OF BIRTH:  1940-12-03   DATE OF ADMISSION:  07/22/2005  DATE OF DISCHARGE:  07/28/2005                                 DISCHARGE SUMMARY   ADMITTING DIAGNOSIS:  End-stage osteoarthritis, left knee.   DISCHARGE DIAGNOSIS:  End-stage osteoarthritis, left knee.   OPERATION:  Total knee arthroplasty, left knee.   HISTORY OF PRESENT ILLNESS:  This is a 70 year old lady well known to [place]  with history of bilateral knee osteoarthritis, left greater than right, who  has failed conservative management of her pain and is having difficulty with  daily activities and now is scheduled for total knee arthroplasty of the  left knee.  The surgery, its benefits and after care were discussed in  detail with the patient, questions invited and answered, and surgery to go  ahead as scheduled.  Please note the patient has latex allergy.  She is also  diabetic and hypertensive.   LABORATORY VALUES:  Admission CBC within normal limits.  Hemoglobin and  hematocrit reached below 9 and 26.4 on the 21st.  PT/PTT within normal  limits on admission; at discharge, PT 21.4 with INR 1.8.  Admission CMET  within normal limits with the exception of slightly elevated glucose at 101  and otherwise within normal limits.  The patient did have a low potassium  down to 2.7 on the 19th and this was corrected to 3.6 on the 20th.  Her  glucose did remain elevated throughout admission with a high of 168.  This  was managed by Dr. Waynard Edwards, her medical doctor.  Urine culture showed no  growth.  C-diff specimen showed negative.   HOSPITAL COURSE:  Consultation with Dr. Waynard Edwards was made for management of  diabetes and hypertension.  On the first postoperative day, the patient was  feeling pretty good. Vital  signs were stable.  Temperature maximum 101.1, O2  93 on 2 L, I&O's good.  Drain was 320.  Hemoglobin and hematocrit were  stable.  BMET showed a mild hypokalemia at 3.4, otherwise normal.  Lungs  were clear.  Bowel sounds active.  Dressing dry.  Calves negative.  Drain  was removed without difficulty.   On the second postoperative day, the patient stated she felt hot.  Her blood  pressure was 182/81, temperature to a maximum of 101.8, O2 88 on room air.  Chest x-ray showed streaky atelectasis.  I&O's were good.  Hemoglobin and  hematocrit were stable.  Potassium at 2.7.  Glucose 168.  The patient was  getting potassium IV.  Urinalysis was negative.  INR 2.3.  Dressing was  changed.  Wound benign.  Calves negative.  O2 saturations were able to be  brought up to 96 with cough and deep breathe.  She had no chest pain and no  shortness of breath.  Her hypokalemia was being corrected by the medical  service and she was increased on  her IS q.30 minutes with cough and deep  breathe.  Her Lovenox was discontinued.  Also noted the patient then  remembered that she took a potassium supplement at home that she did not  tell us on admission and this was restarted by the medical service.   On the third postoperative day, she was feeling better.  Vital signs stable.  Temperature to a maximum of 100.7.  Hemoglobin and hematocrit were stable.  BMET was back to within normal limits with the exception of the elevated  glucose.  INR 2.  Dressing was changed.  Wound benign.  Calves negative.   On the fourth postoperative day, vital signs are stable.  Temperature to  100.7.  Dressing was changed.  Wound was benign.  Calves were negative.  She  continued on physical therapy.  Plans were made for discharge the following  day if her fever went down.   On the fifth postoperative day, vital signs were stable.  Temperature to a  maximum of 101 but now afebrile.  Lungs were clear.  Wound was benign.  Calves were  negative.  The patient subsequently discharged home for followup  in the office.   CONDITION ON DISCHARGE:  Improved.   DISCHARGE MEDICATIONS:  1.  Mepergan Fortis 1-2 q.6h. p.r.n. pain.  2.  Flexeril 5 mg 1 p.o. q.12h. p.r.n. spasm.  3.  Trinsicon 1 b.i.d. for anemia.  4.  Coumadin per pharmacy protocol.  5.  Resume her normal home medications per Dr. Waynard Edwards.   FOLLOW UP:  1.  With Dr. Thomasena Edis in 10 days.  2.  With Dr. Waynard Edwards in 2 weeks.   DISCHARGE INSTRUCTIONS:  She is instructed to do her home exercises and call  the office if any problems or questions arise.      Jaquelyn Bitter. Chabon, P.A.    ______________________________  Erasmo Leventhal, M.D.    SJC/MEDQ  D:  08/11/2005  T:  08/11/2005  Job:  [id number]

## 2011-02-21 NOTE — H&P (Signed)
[facility]  Patient:    [name]                         MRN: [id number] Adm. Date:  06/22/00 Attending:  Janine Limbo, M.D. CC:         Marinda Elk, M.D., [facility], [facility]   History and Physical  HISTORY OF PRESENT ILLNESS:   The patient is a 70 year old female, para 2-3-2-5, who presents with irregular uterine bleeding on hormone replacement therapy.  She also has a history of lower abdominal pain that comes and goes, but is severe when it does occur.  The patient had an endometrial biopsy performed in August 2001 and it showed proliferative, benign endometrium.  Her most recent Pap smear was in April 2001 and it was within normal limits.  The patient is currently taking Fem HRT for hormone replacement therapy.  She continues to have irregular bleeding. The patient has severe dyspareunia premenstrually.  She denies GI and GU symptoms.  She denies a prior history of sexually transmitted infections.  She did have a D&C in the past.  Her husband has had a vasectomy.  Her last mammogram was in April 2001 and it was within normal limits.  OBSTETRICAL HISTORY:          The patient has had two full-term vaginal deliveries and three preterm vaginal deliveries.  She has had two miscarriages.  PAST MEDICAL HISTORY:         The patient has a history of asthma, hypertension, intestinal problems, and joint problems.  She has had an appendectomy in the past, knee surgery and back surgery to remove a large cyst.  CURRENT MEDICATIONS:          Lasix, Neurontin, diovan, Fem HRT, Altase.  DRUG ALLERGIES:               LATEX, SULFA, PENICILLIN, STREPTOMYCIN, ERYTHROMYCIN, TERRAMYCIN, VICODIN, OXYCODONE, DILAUDID, CECLOR, HYDROCODONE, AND MANY OTHER MEDICATIONS INCLUDING VIOXX.  SOCIAL HISTORY:               The patient is married and she is a Arts development officer. She denies cigarette use, alcohol use, and recreational drug use.  FAMILY  HISTORY:               The patient has a family history of heart disease, asthma, intestinal problems, hypertension, strokes, diabetes, joint problems, and emotional problems.  REVIEW OF SYSTEMS:            The patient wears glasses.  She exercises occasionally.  Her diet is regular.  She does monthly breast examinations.  PHYSICAL EXAMINATION:  GENERAL:                      Weight is 236 pounds, height 5 feet, 4 inches.  CHEST:                        Clear.  HEART:                        Regular rate and rhythm.  ABDOMEN:                      Nontender and no masses are appreciated.  BREASTS:                      Without masses.  EXTREMITIES:                  Within normal limits.  NEUROLOGIC EXAMINATION:       Normal.  PELVIC EXAMINATION:           External genitalia is normal.  The vagina is normal except for pelvic relaxation.  Cervix is nontender and no lesions are present.  Uterus is upper limits normal size.  Adnexa no masses appreciated, nontender.  ASSESSMENT:                   1. Irregular uterine bleeding.                               2. Pelvic and abdominal pain of uncertain                                  etiology and of longstanding duration.  PLAN:                         We reviewed our options for management.  The patient has elected to proceed with diagnostic laparoscopy, hysteroscopy and dilatation and curettage.  The patient understands the indications for her surgical procedure and she accepts the risks of, but not limited to, anesthetic complications, bleeding, infection, and possible damage to the surrounding organs.  She understands that no guarantees can be given concerning the total relief of her pain.  We reviewed the status of her ovaries.  After careful consideration, she and her husband would like to proceed with oophorectomy at the time of laparoscopy.  They understand that there is a small chance of developing ovarian cancer or some  other complication in the future.  They understand that the ovaries do produce hormones even in the postmenopausal state.  Although, their production is less than premenopausal.  After considering all of her options, she again wishes to proceed with oophorectomy with bilateral oophorectomy through the laparoscope. D:  06/20/00 TD:  06/20/00 Job: [id number]

## 2011-02-21 NOTE — Discharge Summary (Signed)
[name], [name]                ACCOUNT NO.:  [id number]   MEDICAL RECORD NO.:  [id number]          PATIENT TYPE:  INP   LOCATION:  1516                         FACILITY:  [facility]   PHYSICIAN:  Erasmo Leventhal, M.D.DATE OF BIRTH:  1940-11-04   DATE OF ADMISSION:  11/11/2005  DATE OF DISCHARGE:  11/15/2005                                 DISCHARGE SUMMARY   NEEDS REPEAT CHEST X-RAY IN THREE MONTHS FOR EVALUATION OF RIGHT HILAR  NODULARITY.   ADMISSION DIAGNOSIS:  End-stage osteoarthritis, right knee.   DISCHARGE DIAGNOSIS:  End-stage osteoarthritis, right knee.   OPERATION:  Total knee arthroplasty, right knee.   BRIEF HISTORY:  This 70 year old female with a history of end-stage  osteoarthritis with previous total knee arthroplasty of the left knee with  good results.  She has now failed conservative measures for her right knee  and is scheduled for total knee arthroplasty.  The surgery, risks, benefits  and aftercare were discussed in detail with the patient, questions invited  and answered.  Surgery will proceed.  We will get medical management from  Dr. Waynard Edwards and his group postoperatively.   CONSULTATIONS:  Dr. Waynard Edwards.   LABORATORY VALUES:  Admission CBC within normal limits except the RDW high  at 14.7.  She reached a low on her hemoglobin and hematocrit of 10.1 and  30.4 on the 9th.  Admission PT/PTT within normal limits.  On discharge, PT  22.2 with INR of 1.9.  Admission CMET showed the glucose high at 106,  otherwise normal.  It remained mildly high through her admission, and she  had hypokalemia at 3.1 on November 14, 2005.  Urine culture showed no growth.   HOSPITAL COURSE:  The patient tolerated the operative procedure well.  Postoperative consult with Dr. Waynard Edwards was obtained for management of  diabetes and hypertension.  On the first postoperative day, vital signs are  stable.  Temperature to a max of 101.6.  O2 sat is 91% on 2 liters.  Hemovac  had  moderate drainage at 210 and was removed without difficulty.  Lungs were  clear with decreased sounds at the bases.  Bowel sounds sluggish.  Heart  sounds normal.  Dressing was dry.  Mild lateral calf tenderness was noted.  No medial calf tenderness.  Negative Homans' sign.  Incentive spirometry was  increased.  Aspirin was put on hold while on Coumadin therapy.   On the second postoperative day, she was in a lot of pain with the CPM the  previous night.  Her vital signs were stable.  Her BP was mildly elevated.  Her temp to 102.5.  O2 sats is 90% on 2 liters, 97% after cough and deep  breath.  I&O was good.  Hemoglobin 10.9, hematocrit 32.4.  BMET normal  except for elevated glucose.  Lungs were clear with decreased sounds at the  bases.  Bowel sounds sluggish.  Heart sounds unchanged.  Dressing was  changed.  Wound was benign.  Calves were negative.  A chest x-ray was  obtained due to her continued temperature and decreased breath sounds at the  bases.  UA with culture and sensitivity was obtained.  PCA was DC'd, and she  was switched over to p.o. medicines and IS with cough and deep breathing  every 30 minutes was ordered.   On the third postoperative day, she was feeling better.  Vital signs were  stable.  Temp to 99.  Hemoglobin and hematocrit 10.1 and 30.4.  She had mild  hypokalemia at 3.1.  Chest x-ray showed a stable nodule on her lungs that  needed recheck by the radiology report in three months.  No pneumonia or  atelectasis was noted.  Urinalysis was cleared for infection.  Her dressing  was changed.  Her wound was benign.  Calves were negative.  Patient was  placed on potassium supplementation and further followup obtained.   On February 10, vital signs stable.  Afebrile.  Wound benign.  Calves  negative.  She was subsequently discharged home for followup in the office  in 10 days.   CONDITION ON DISCHARGE:  Improved.   DISCHARGE MEDICATIONS:  1.  Mepergan Fortis 1 q.6h.  p.r.n. pain.  2.  Coumadin per pharmacy protocol.  3.  Trinsicon 1 b.i.d. for anemia.   She will follow up with Dr. Waynard Edwards in three months for repeat chest x-ray to  follow the stable appearance of the right hilar nodularity noted on her  chest x-ray.  She will follow up with Dr. Thomasena Edis in 10 days.   DISCHARGE INSTRUCTIONS:  She is instructed to elevate and ice the knee, do  her CPM and home exercises and call if any problems or questions arise.      Jaquelyn Bitter. Chabon, P.A.    ______________________________  Erasmo Leventhal, M.D.    SJC/MEDQ  D:  11/21/2005  T:  11/21/2005  Job:  [id number]   cc:   Loraine Leriche A. Perini, M.D.  Fax: [phone]

## 2011-02-21 NOTE — H&P (Signed)
NAME:  [name], [name]                ACCOUNT NO.:  [id number]   MEDICAL RECORD NO.:  [id number]          PATIENT TYPE:  INP   LOCATION:  NA                           FACILITY:  [facility]   PHYSICIAN:  Erasmo Leventhal, M.D.DATE OF BIRTH:  11-04-1940   DATE OF ADMISSION:  11/11/2005  DATE OF DISCHARGE:                                HISTORY & PHYSICAL   CHIEF COMPLAINT:  Right knee end-stage osteoarthritis.   HISTORY OF PRESENT ILLNESS:  This is a 70 year old female with a history of  end-stage osteoarthritis with previous total knee arthroplasty of her left  knee with good result. She has end-stage osteoarthritis of her right knee  and it continues to bother her despite conservative treatment. She  understands that she now needs total knee arthroplasty of the right knee.  The surgery risks, benefits and aftercare were discussed in detail with the  patient, questions were invited and answered and surgery is to go ahead as  scheduled. Please note on her H&P today, her blood pressure was 196/96 and  she has been sent to Dr. Waynard Edwards, her medical doctor, for evaluation today  and on his clearance, we will proceed with surgery but if he decides that  she needs further workup of her blood pressure prior to surgery then her  surgery will be rescheduled.   PAST MEDICAL HISTORY:  Drug allergies include PENICILLIN with rash and  difficulty breathing, SULFA with a rash, STREPTOMYCIN with nausea and hives,  ERYTHROMYCIN with nausea, TERRAMYCIN with nausea, CECLOR with severe hives,  nausea and difficulty breathing, VICODIN with hives, OXYCODONE with  disorientation and nausea, HYDROCODONE with disorientation and nausea,  DILAUDID with severe hives and ROBAXIN with severe nausea. Also NEURONTIN  with swelling of legs and feet and LYRICA with swelling of legs and feet.   CURRENT MEDICATIONS:  1.  Nasacort AQ 2 sprays each nostril once a day.  2.  Protonix 40 mg once a day.  3.  Atenolol 25 mg 1  b.i.d.  4.  Darvocet-N 100 1 q.6 h p.r.n. pain.  5.  Diovan/hydrochlorothiazide 160/12.5 one time a day.  6.  Metformin tablets 2 pills b.i.d.  7.  Albuterol 2 sprays every 4 h as needed.  8.  Aspirin and Mobic which she has stopped.   PAST SURGICAL HISTORY:  Appendectomy, D&C, multiple knee surgeries including  left total knee arthroplasty and oophorectomy.   SERIOUS MEDICAL ILLNESSES:  Diabetes, hypertension and asthma.   SOCIAL HISTORY:  The patient is married, she is a homemaker, she does not  smoke or drink.   FAMILY HISTORY:  Positive for coronary artery disease, hypertension,  diabetes, cancer, CVA and osteoarthritis.   REVIEW OF SYSTEMS:  CENTRAL NERVOUS SYSTEM:  Negative for __________ blurred  vision or dizziness. PULMONARY:  Positive for asthma with exertional  shortness of breath, negative PND and orthopnea. CARDIOVASCULAR:  Positive  for hypertension, negative for chest pain or palpitations. GI:  Positive for  hiatal hernia and reflux. GU:  Negative for urinary tract difficulty.  MUSCULOSKELETAL:  Positive as in HPI.   PHYSICAL EXAMINATION:  VITAL  SIGNS:  BP 190/98 and then 196/96. Respirations  16, pulse 62 and regular.  GENERAL:  This is a well-developed, well-nourished lady in no acute  distress.  HEENT:  Head normocephalic, nose patent, ears patent, pupils equal round and  reactive to light. Throat without injection.  NECK:  Supple without adenopathy, carotids 2+ without bruit.  CHEST:  Clear to auscultation, no rales or rhonchi. Respirations 16.  HEART:  Regular rate and rhythm at 62 beats per minute without murmur.  ABDOMEN:  Soft without active bowel sounds. No masses or organomegaly.  NEUROLOGIC:  The patient is alert and oriented to time, place and person.  Cranial nerves II-XII grossly intact.  EXTREMITIES:  Show left knee status post total knee arthroplasty with 0-110  range of motion. The right knee shows a varus deformity, 0-125 degree range  of  motion. Sensation and circulation are intact. Dorsalis pedis and  posterior tibialis pulses are 1+. X-rays show end-stage osteoarthritis of  the right knee.   IMPRESSION:  End-stage osteoarthritis of the right knee.   PLAN:  Total knee arthroplasty right knee.      Jaquelyn Bitter. Chabon, P.A.    ______________________________  Erasmo Leventhal, M.D.    SJC/MEDQ  D:  11/07/2005  T:  11/07/2005  Job:  [id number]

## 2011-02-21 NOTE — H&P (Signed)
NAME:  [name], HOLSTROM                ACCOUNT NO.:  [id number]   MEDICAL RECORD NO.:  [id number]           PATIENT TYPE:   LOCATION:                                 FACILITY:   PHYSICIAN:  Almedia Balls. Ranell Patrick, M.D. DATE OF BIRTH:  August 30, 1941   DATE OF ADMISSION:  DATE OF DISCHARGE:                              HISTORY & PHYSICAL   CHIEF COMPLAINT:  Left shoulder pain.   HISTORY OF PRESENT ILLNESS:  The patient is a 70 year old female with  worsening left shoulder pain that has been refractive to conservative  treatment.  Patient has elected to have a left total shoulder  arthroplasty with Dr. Malon Kindle.   Past medical history is significant for aortic stenosis with murmur,  hypertension, and diabetes.   FAMILY MEDICAL HISTORY:  History of coronary artery disease.   SOCIAL HISTORY:  The patient does not smoke or drink.  Patient of Dr.  Reyes Ivan.   DRUG ALLERGIES:  PENICILLIN, SULFA, STREPTOMYCIN, ERYTHROMYCIN,  TERRAMYCIN, CECLOR, VICODIN, OXYCODONE, HYDROCODONE, DILAUDID ROBAXIN,  NEURONTIN, and LYRICA.   CURRENT MEDICATIONS:  1. Nexium 40 mg p.o. daily.  2. Lasix 20 mg p.o. daily.  3. Darvocet-N 100 1-2 tablets q.4-6 h. p.r.n. pain.  4. Diovan HCT 320/25 one daily.  5. Metformin 500 mg b.i.d.  6. Albuterol 2 sprays q.4 h. p.r.n.  7. Simvastatin 20 mg p.o. nightly.  8. Aspirin 81 mg p.o. daily.  9. Potassium 20 mEq b.i.d.  10.Carvedilol 12.5 mg b.i.d.  11.Ranexa 1000 mg daily.  12.Fluticasone 2 sprays each nostril daily.  13.CoQ10 100 mg p.o. daily.  14.Calcium with vitamin D b.i.d., daily vitamin.   REVIEW OF SYSTEMS:  She is status post bilateral total knee  arthroplasties with worsening left shoulder pain with range of motion.   PHYSICAL EXAMINATION:  Pulse 80, blood pressure was 152/100,  respirations were 80 here in the office today.  The patient is a 66-year-  old healthy-appearing female in no acute distress.  Pleasant mood and  affect.  Alert and oriented x3.   Examination of the cervical spine shows  no tenderness to palpation of paraspinal muscle or spinous process.  She  has full range of motion without difficulty.  Cranial nerves II-XII are  grossly intact.  Examination of his left shoulder shows forward flexion  10 degrees, external rotation 10 degrees, internal rotation adamant.  Capillary refill less than 2 seconds.  Neurologically, she is intact.  She has decreased range of motion secondary to pain.  Grip strength 5/5  bilaterally.  Chest has active breath sounds bilaterally.  No wheezes,  rhonchi, or rales.  Heart shows 3/6 systolic murmur.  Abdomen is  nontender, nondistended with active bowel sounds.  Skin shows no rashes  or edema.  She does have a normal gait.  X-rays show end-stage  osteoarthritis of the left shoulder.   IMPRESSION:  End-stage osteoarthritis of the left shoulder.   PLAN OF ACTION:  She will have a left total shoulder arthroplasty by Dr.  Malon Kindle.      Thomas B. Dixon, P.A.  Almedia Balls. Ranell Patrick, M.D.  Electronically Signed    TBD/MEDQ  D:  02/23/2008  T:  02/24/2008  Job:  [id number]

## 2011-02-21 NOTE — H&P (Signed)
NAME:  [name], [name]                ACCOUNT NO.:  [id number]   MEDICAL RECORD NO.:  [id number]          PATIENT TYPE:  INP   LOCATION:  NA                           [facility]ACILITY:  [facility]   PHYSICIAN:  Erasmo Leventhal, M.D.DATE OF BIRTH:  [birth year]   DATE OF ADMISSION:  07/22/2005  DATE OF DISCHARGE:                                HISTORY & PHYSICAL   PATIENT IS LATEX ALLERGIC.   CHIEF COMPLAINT:  Bilateral knee end-stage osteoarthritis, left greater than  right.   HISTORY OF PRESENT ILLNESS:  This is a 70 year old lady with a history of  end-stage osteoarthritis of her bilateral knees.  She has failed  conservative treatment and due to continued pain, discomfort, and problems  with activities of daily living, the patient is now scheduled for total knee  arthroplasty of the left knee.  The surgery, risks, benefits and aftercare  were discussed in detail with the patient and questions invited and  answered.   DRUG ALLERGIES:  1.  PENICILLIN.  2.  SULFA.  3.  STREPTOMYCIN.  4.  CECLOR.  5.  DILAUDID.   INTOLERANCES:  E-MYCIN, TERRAMYCIN, OXYCODONE, HYDROCODONE, ROBAXIN, all of  which cause nausea.  She also has an intolerance of NEURONTIN and LYRICA,  which cause swelling in her legs and feet.   CURRENT MEDICATIONS:  1.  Nasacort 2 sprays daily.  2.  Protonix 40 mg 1 daily.  3.  Diovan/hydrochlorothiazide 160/12.5 2 daily.  4.  Metformin 500 mg 2 b.i.d.  5.  Albuterol 2 sprays q.4h. p.r.n.  6.  Mobic 7.5 mg 1 p.o. daily.  7.  Darvocet 1 q.6h. p.r.n. pain.   PAST SURGICAL HISTORY:  Appendectomy, oophorectomy, knee arthroscopy, and  excision of a spinal lipoma.   PAST MEDICAL HISTORY:  Serious medical illnesses include hypertension,  asthma, GERD, and diabetes.   FAMILY HISTORY:  Positive for coronary artery disease, hypertension,  diabetes, cancer, and CVA.   REVIEW OF SYSTEMS:  CENTRAL NERVOUS SYSTEM:  Negative for headache, blurry  vision or dizziness.  PULMONARY:   Positive for shortness of breath  associated with asthma.  Negative for PND and orthopnea.  CARDIOVASCULAR:  Negative for chest pain or palpitations.  Positive for hypertension.  GI:  Positive for reflux.  No history of ulcers.  GU:  Negative for urinary tract  difficulty.  MUSCULOSKELETAL:  Positive, as in HPI.   PHYSICAL EXAMINATION:  VITAL SIGNS:  BP 200/108 in the right arm, 198/106 in  the left arm, respirations 18, pulse 72 and regular.  GENERAL APPEARANCE:  This is a well-developed, well-nourished, obese lady in  no acute distress.  HEENT:  Head normocephalic.  Nose patent.  Ears patent.  Pupils are equal,  round and reactive to light.  Throat without injection.  NECK:  Supple without adenopathy.  Carotids 2+ without bruits.  CHEST:  Clear to auscultation.  No rales or rhonchi.  RESPIRATORY:  Respirations 18.  HEART:  Regular rate and rhythm at 72 beats per minute without murmur.  ABDOMEN:  Soft with active bowel sounds.  No masses or organomegaly.  NEUROLOGIC:  Patient alert and oriented to time, place, and person.  Cranial  nerves II-XII are grossly intact.  EXTREMITIES:  Bilateral knees with end-stage osteoarthritis.  She has  decreased range of motion with pain and crepitation throughout the range of  motion.  Sensation and circulation are intact.  Dorsalis pedis and posterior  tibial pulses are 1+.  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   X-rays show end-stage osteoarthritis of the left knee.   IMPRESSION:  End-stage osteoarthritis, left knee.   PLAN:  Total knee arthroplasty, left knee, after clearance by her medical  doctor.  She was sent over to her medical doctor's office today.  Dr. Timothy Lasso,  who on call for Dr. Waynard Edwards, for her elevated blood pressure.  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  Surgery will go ahead as scheduled, pending  clearance by their office.      Jaquelyn Bitter. Chabon, P.A.    ______________________________  Erasmo Leventhal, M.D.    SJC/MEDQ  D:  07/17/2005  T:  07/17/2005  Job:  [id number]

## 2011-02-21 NOTE — Consult Note (Signed)
NAME:  [name], PRISK                ACCOUNT NO.:  [id number]   MEDICAL RECORD NO.:  [id number]          PATIENT TYPE:  INP   LOCATION:  1515                         FACILITY:  [facility]   PHYSICIAN:  Mark A. Perini, M.D.   DATE OF BIRTH:  14-Mar-1941   DATE OF CONSULTATION:  07/22/2005  DATE OF DISCHARGE:                                   CONSULTATION   REFERRED BY:  Erasmo Leventhal, M.D.   REASON FOR CONSULTATION:  We were asked to see this patient for diabetes and  hypertension management.   HISTORY OF PRESENT ILLNESS:  [name] is a pleasant 70 year old female with  a past history of hypertension and type 2 diabetes and significant  generalized osteoarthritis. She has end-stage bilateral knee arthritis. She  subsequently underwent a left total knee arthroplasty earlier today.  Currently she reports some nausea and has vomited a few times with her pain  pump. She also had some pain in her right knee joint at this time.   PAST MEDICAL HISTORY:  1.  Appendectomy.  2.  Oophorectomy.  3.  Knee arthroscopy.  4.  Excision of spinal lipoma.  5.  Hypertension.  6.  Asthma.  7.  Gastroesophageal reflux.  8.  Type 2 diabetes.   ALLERGIES:  PENICILLIN, SULFA, STREPTOMYCIN, CECLOR, and DILAUDID.   MEDICATION INTOLERANCES:  Erythromycin, Terramycin, oxycodone, hydrocodone,  Robaxin which all cause nausea and Neurontin and Lyrica cause swelling of  the legs.   CURRENT MEDICATIONS:  1.  Nasacort.  2.  Protonix 40 mg daily.  3.  Diovan HCTZ 160/12.5.  4.  Metformin 1000 mg twice daily.  5.  Albuterol as needed.  6.  Mobic 7.5 mg daily which has been on hold recently.  7.  Darvocet as needed.   SOCIAL HISTORY:  She is married, no alcohol, drug use or tobacco.   FAMILY HISTORY:  Positive for coronary disease, hypertension, diabetes,  cancer and stroke.   REVIEW OF SYMPTOMS:  As per the history of present illness.   PHYSICAL EXAMINATION:  VITAL SIGNS:  Temperature 96.2, pulse  69, respiratory  rate 16, blood pressure 145/77, 98% saturation on 2 liters of oxygen.  GENERAL:  She is semisupine, no acute distress.  LUNGS:  Clear to auscultation bilaterally with no wheezes, rales or rhonchi.  HEART:  Regular rate and rhythm with no murmurs, rubs or gallops.  ABDOMEN:  Soft, nontender and obese. There is no significant edema.  EXTREMITIES:  She has nodal osteoarthritis changes but no inflammatory  arthritis changes of her hands.  NEUROLOGIC:  She is drowsy but responds appropriately.   LABORATORY DATA:  On July 16, 2005, urinalysis negative, coagulation  studies were normal. Sodium is 137, potassium 3.5, chloride 101, CO2 30, BUN  19, creatinine 0.8, glucose 101. LFTs were normal. White count was 5.8 with  a normal differential. Hemoglobin 13.1, platelet count 252,000. CT scan done  in the last few days for a possible pulmonary nodule showed only a calcified  granuloma but no worrisome findings. She did have some incidental thyroid  nodules found.  ASSESSMENT/PLAN:  1.  Type 2 diabetes. Will place on sliding scale NovoLog insulin and low      dose Lantus 8 units subcu q.h.s. Glucophage is on hold due to recent CT      scan.  2.  Hypertension. Will follow this with you.  3.  Status post left total knee arthroplasty per orthopedics.  4.  Thyroid nodules incidentally found on CT scan. Will obtain an ultrasound      of the thyroid as an outpatient.  5.  Nausea and multiple drug intolerances. We will have to manage this as      best we can with antiemetics and using as little analgesics as we can      get by with.           ______________________________  Redge Gainer. Waynard Edwards, M.D.     MAP/MEDQ  D:  07/22/2005  T:  07/22/2005  Job:  [id number]

## 2011-02-21 NOTE — Op Note (Signed)
NAME:  TERESINA, BUGAJ                ACCOUNT NO.:  [id number]   MEDICAL RECORD NO.:  [id number]          PATIENT TYPE:  INP   LOCATION:  X001                         FACILITY:  [facility]   PHYSICIAN:  Erasmo Leventhal, M.D.DATE OF BIRTH:  07-13-1941   DATE OF PROCEDURE:  07/22/2005  DATE OF DISCHARGE:                                 OPERATIVE REPORT   PREOPERATIVE DIAGNOSIS:  Left knee osteoarthritis.   POSTOPERATIVE DIAGNOSIS:  Left knee osteoarthritis.   PROCEDURE:  Left total knee arthroplasty.   SURGEON:  Erasmo Leventhal, M.D.   ASSISTANT:  Jaquelyn Bitter. Chabon, P.A.-C.   ANESTHESIA:  Spinal.   ESTIMATED BLOOD LOSS:  Less than 100 mL.   DRAINS:  Two medium Hemovac.   COMPLICATIONS:  None.   TOURNIQUET TIME:  1 hour and 30 minutes at 375 mmHg.   COMPLICATIONS:  None.   DISPOSITION:  PACU stable.   OPERATIVE IMPLANTS:  DePuy sigma total knee. Size 4 femur, size 4 tibia. A  10 mm rotating platform tibial insert and a 35 mm all polyethylene patella,  all cemented.   DESCRIPTION OF PROCEDURE:  The patient counseled in the holding area,  correct side was identified. IV started, antibiotics were given. Taken to  the OR where the spinal was administered. Foley catheter was placed  utilizing sterile technique by the OR circulating nurse. __________  She had  a 5 degree flexion contraction, she could flex to 120 degrees. Elevated,  prepped with DuraPrep, draped in a sterile fashion. Exsanguinated with  esmarch, tourniquet was inflated to 375 mmHg due to the large size of her  thigh. A straight midline incision was made through the skin and  subcutaneous tissue, vessels electrocoagulated, medial and lateral soft  tissue flaps were developed. Medial parapatellar arthrotomy was performed. A  proximal medial soft tissue release was done, patella was displaced but not  everted out of way. End-stage arthritic change, bone against bone,  osteophytes removed, intercondylar  notch and cruciate ligaments were  resected. A starting hole made in the distal femur, canal was irrigated, the  effluent was clear. The intramedullary rod was gently placed. I chose a 5  degree valgus cut with an 11 mm cut off the distal femur due to her flexion  contracture. This was then done. The distal femur was found to be a size #4.  Rotational marks were made for external rotation and the femur cut to fit a  size #4. Medial and lateral menisci removed, geniculate vessels were  coagulated. The posterior neurovascular structures were thought of and  protected throughout the entire case. The tibial eminence was resected and  osteophytes removed from the proximal medial tibia. The proximal tibia was  found to be a size 4. A central starting hole was made, step reamer was  utilized, canal was irrigated, the effluent was clear, intramedullary rod  was gently placed and I chose a zero degree slope with a 10 mm cut based  upon the lateral side which was least efficient. Posteromedial and  posterofemoral osteophytes removed under direct visualization. At this time  with flexion  extension blocks, we were tight. Therefore another 10 mm was  taken from the proximal tibia at this time with a 10 degree flexion  extension block, we were well balanced. The tip of the tray was then placed.  The step reamer was utilized and the keel punch was in place. The distal  femoral box cut was then prepared in standard fashion. At this time, a size  4 tibia, size 4 femur with a 10 mm insert with excellent range of motion and  soft tissue balance to varus and valgus. The patella was found to be a size  35. It was cut to the appropriate amount, locking holes were made and with  the __________  patella, we had a little bit of tilt in the patella  therefore a modified lateral release was done to balance the patella well.  The knee was then well balanced in flexion and extension, patellofemoral  tracking was  anatomic, the wounds were irrigated. All trials were then  removed and the knee was irrigated with pulsatile lavage. Utilizing modern  cement technique, all components were cemented into place, size 4 tibia,  size 4 femur, 35 patella with a 10 mm rotating platform tibial insert. After  the cement had cured, excess cement was removed. The trial was well  balanced, trial was then removed and put in a final component 10 mm rotating  platform tibial insert. We had an excellent, well balanced, well implanted  aligned knee, irrigated. The two medium Hemovac drains were placed. The  arthrotomy was closed with Vicryl, each layer was irrigated during the  closure. Subcu closed with Vicryl. The skin was stapled due to the thin  nature of her skin anteriorly and did not feel it would hold a subcuticular  stitch very well. A sterile dressing was applied to the knee, tourniquet was  deflated, normal circulation in the foot and ankle at the end of the case.  No complications or problems. Ice pack and knee immobilizer applied. She  tolerated the procedure well and she was taken from the operating room to  PACU in stable condition. There were no complications or problems.   To help with technical assistance throughout the entire case, Mr. Leilani Able, P.A.-C. assistance was needed.           ______________________________  Erasmo Leventhal, M.D.     RAC/MEDQ  D:  07/22/2005  T:  07/22/2005  Job:  [id number]   cc:   Loraine Leriche A. Perini, M.D.  Fax: [phone]

## 2011-02-21 NOTE — Op Note (Signed)
[name], [name]                ACCOUNT NO.:  [id number]   MEDICAL RECORD NO.:  [id number]          PATIENT TYPE:  INP   LOCATION:  0098                         FACILITY:  [facility]   PHYSICIAN:  Erasmo Leventhal, M.D.DATE OF BIRTH:  1941-09-23   DATE OF PROCEDURE:  11/11/2005  DATE OF DISCHARGE:                                 OPERATIVE REPORT   PREOPERATIVE DIAGNOSIS:  Right knee end-stage osteoarthritis.   POSTOPERATIVE DIAGNOSIS:  Right knee end-stage osteoarthritis.   PROCEDURE:  Right total knee arthroplasty.   SURGEON:  Erasmo Leventhal, M.D.   ASSISTANT:  Jaquelyn Bitter. Chabon, PA-C.   ANESTHESIA:  Spinal.   ESTIMATED BLOOD LOSS:  Less than 100 mL.   DRAINS:  Two medium hemovac.   COMPLICATIONS:  None.   TOURNIQUET TIME:  1 hour and 30 minutes at 350 mmHg.   OPERATIVE IMPLANTS:  De[employer]uy [employer] and [employer]. All  cemented. Size 4 femur, 4 tibia, 12.5 rotating platform tibial insert, 35 mm  patella.   DESCRIPTION OF PROCEDURE:  The patient was counseled in the holding area,  correct side was identified, correct side was marked. Chart reviewed and  signed appropriately. Taken to the OR, placed in supine position, spinal  anesthetic was administered. She was given vancomycin 500 mg preop due to  her multiple allergies. She also has a LATEX allergy and we used latex  precautions. Foley catheter placed using sterile technique by the OR  circulating nurse. All extremities well padded and bumped. The right knee  was examined and 5 degree flexion contracture, flexed to 125 degrees.  Elevated prepped with DuraPrep and was draped in a sterile fashion.  Exsanguinated with Esmarch, tourniquet was inflated to 350 mmHg. A straight  midline incision was made through the skin and subcutaneous tissue, small  vessels were electrocoagulated, medial parapatellar arthrotomy was  performed. The patella was flipped, knee was flexed, end-stage arthritic  changes.  The cruciate ligaments were resected. A starting hole made in the  distal femur, canal was irrigated until the effluent was clear. The  intramedullary rod was gently placed. I chose a 5 degree valgus cut with an  11 mm cut off the distal femur and did a flexion contracture. The distal  femur was found to be a size #4, rotation marks were made. The distal femur  was cut to fit a size 4. She had large degenerative cysts in the  mediofemoral condyle and these were meticulously curetted making sure we had  a nice stable rim and there was.   The tibial eminence was resected, osteophytes removed from the proximal  tibia. Medial and lateral meniscal remnants removed, medial and lateral  geniculate vessels were coagulated. The posterior neurovascular structures  were thought of and protected throughout the entire case. The proximal tibia  was found to be a size 4, central area was identified, reamer was then  placed. A step reamer was utilized, canal was irrigated, effluent was clear.  The intramedullary rod was gently placed. I initially chose a 2 mm cut off  the defect of the medial side at  a zero degree slope. Posteromedial and  posterolateral femoral osteophytes were removed under direct visualization.  At this time with flexion extension blocks, we were well balanced with 10 mm  of flexion extension blocks well balanced. The knee was then flexed, the 4  mm trial was then placed, rotation was set as it was covered. The reamer was  then utilized and the punch. At this time, the femoral box cut was prepared  in the standard fashion. With a size 4 femur, size 4 tibia, 10 mm insert, we  had good range of motion, flexion extension and appeared to be appropriately  balanced. The patella was found to be a size 35. It was recessed at the  appropriate level, locking holes were made and a 35 mm patella trial was  then placed. At this time, we had excellent range of motion in the balance  of the trial  implants. All of these were then removed utilizing pulsatile  lavage. The knee was copiously irrigated. Utilizing modern cement technique,  all components were cemented into place. Size 4 femur, size 4 tibia, 35  patella. After the cement had cured, excess cement was removed. Bone wax was  placed on exposed bony surfaces and we did a 12.5 mm tibial insert trial and  we had better flexion extension gaps, varus and valgus stress, excellent  flexion extension. Patellofemoral tracking was anatomic. The trial was then  removed, we put in a final implant, 12.5 mm posterior stabilized rotating  platform tibial insert. The wounds were copiously irrigated. Two medium  hemovac drains were placed. Sequential closure of the layers were done,  arthrotomy Vicryl, subcu Vicryl. Due to her large body habitus and fleshy  thigh and thin skin, we opted to staple the skin. This was then done nicely,  covered with a sterile gauze, drain hooked to suction, sterile dressing was  applied, tourniquet was deflated, normal circulation in the foot at the end  of the case. Ice pack was applied, knee immobilizer in full extension. She  tolerated the procedure well, there were no complications. Sponge and needle  count were correct. The patient was taken from the operating room to PACU in  stable condition.   To decrease surgical time and help with retraction, etc., Mr. Leilani Able,  PA assistance was needed.           ______________________________  Erasmo Leventhal, M.D.     RAC/MEDQ  D:  11/11/2005  T:  11/11/2005  Job:  [id number]

## 2011-03-11 ENCOUNTER — Encounter: Payer: Self-pay | Admitting: Internal Medicine

## 2011-03-15 ENCOUNTER — Encounter: Payer: Self-pay | Admitting: Internal Medicine

## 2011-03-28 ENCOUNTER — Ambulatory Visit (AMBULATORY_SURGERY_CENTER): Payer: Medicare Other | Admitting: *Deleted

## 2011-03-28 VITALS — Ht 62.5 in | Wt 221.1 lb

## 2011-03-28 DIAGNOSIS — Z86010 Personal history of colonic polyps: Secondary | ICD-10-CM

## 2011-03-28 MED ORDER — PEG-KCL-NACL-NASULF-NA ASC-C 100 G PO SOLR: ORAL | Status: DC

## 2011-03-28 NOTE — Progress Notes (Signed)
Pt says that her father had a rare type of "stomach polyp" and she wonders if she should be checked for this also.  Offered to make office visit to discuss with Dr. Marina Goodell.  Pt says that she would like to keep colonoscopy appointment and talk with Dr. Marina Goodell day of procedure. Ezra Sites'

## 2011-04-10 ENCOUNTER — Encounter: Payer: Self-pay | Admitting: Internal Medicine

## 2011-04-10 ENCOUNTER — Ambulatory Visit (AMBULATORY_SURGERY_CENTER): Payer: Medicare Other | Admitting: Internal Medicine

## 2011-04-10 VITALS — BP 126/61 | HR 65 | Temp 97.8°F | Resp 21 | Ht 64.0 in | Wt 214.0 lb

## 2011-04-10 DIAGNOSIS — D126 Benign neoplasm of colon, unspecified: Secondary | ICD-10-CM

## 2011-04-10 DIAGNOSIS — Z860100 Personal history of colon polyps, unspecified: Secondary | ICD-10-CM

## 2011-04-10 DIAGNOSIS — Z1211 Encounter for screening for malignant neoplasm of colon: Secondary | ICD-10-CM

## 2011-04-10 MED ORDER — SODIUM CHLORIDE 0.9 % IV SOLN: 500.0000 mL | INTRAVENOUS | Status: DC

## 2011-04-10 NOTE — Patient Instructions (Signed)
Follow discharge instructions.  Continue your medications.  Await pathology results. 

## 2011-04-11 ENCOUNTER — Telehealth: Payer: Self-pay | Admitting: *Deleted

## 2011-04-11 NOTE — Telephone Encounter (Signed)

## 2011-05-26 ENCOUNTER — Ambulatory Visit (INDEPENDENT_AMBULATORY_CARE_PROVIDER_SITE_OTHER): Payer: Medicare Other | Admitting: Cardiovascular Disease

## 2011-05-26 ENCOUNTER — Encounter: Payer: Self-pay | Admitting: Cardiovascular Disease

## 2011-05-26 DIAGNOSIS — L97509 Non-pressure chronic ulcer of other part of unspecified foot with unspecified severity: Secondary | ICD-10-CM

## 2011-05-26 DIAGNOSIS — I10 Essential (primary) hypertension: Secondary | ICD-10-CM | POA: Insufficient documentation

## 2011-05-26 DIAGNOSIS — Z01810 Encounter for preprocedural cardiovascular examination: Secondary | ICD-10-CM

## 2011-05-26 DIAGNOSIS — I359 Nonrheumatic aortic valve disorder, unspecified: Secondary | ICD-10-CM

## 2011-05-26 DIAGNOSIS — E119 Type 2 diabetes mellitus without complications: Secondary | ICD-10-CM | POA: Insufficient documentation

## 2011-05-26 DIAGNOSIS — I350 Nonrheumatic aortic (valve) stenosis: Secondary | ICD-10-CM | POA: Insufficient documentation

## 2011-05-26 NOTE — Assessment & Plan Note (Signed)
Appears mild on exam.  Echo for gradients

## 2011-05-26 NOTE — Assessment & Plan Note (Signed)
A1C with Perrini.  Importance of control for wound healing discussed

## 2011-05-26 NOTE — Patient Instructions (Signed)
Your physician wants you to follow-up in: ONE YEAR You will receive a reminder letter in the mail two months in advance. If you don't receive a letter, please call our office to schedule the follow-up appointment.   Your physician has requested that you have an echocardiogram. Echocardiography is a painless test that uses sound waves to create images of your heart. It provides your doctor with information about the size and shape of your heart and how well your heart's chambers and valves are working. This procedure takes approximately one hour. There are no restrictions for this procedure.   Your physician has requested that you have a lower extremity arterial duplex. During this test, ultrasound is used to evaluate arterial blood flow in the legs. Allow one hour for this exam. There are no restrictions or special instructions.

## 2011-05-26 NOTE — Progress Notes (Signed)
70 yo of Dr Deborah Chalk.  History of mild AS.  No echo since 2010.  Peak gradient at that time only 25 mmHg.  No history of CAD.  CRF;s DM and HTN.  Obese with poor diet.  Multiple previous surgeries including appendectomy,  RTKR and shoulder surgery x2 .  Has asthma and reflux and with obesity is at higher risk for asspiration.    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  She indicates circulation not tested recently but problems not thought to be from poor circulation.  Very uncomfortable in boot.  Uneven gait causes back pain.  ROS: Denies fever, malais, weight loss, blurry vision, decreased visual acuity, cough, sputum, SOB, hemoptysis, pleuritic pain, palpitaitons, heartburn, abdominal pain, melena, lower extremity edema, claudication, or rash.  All other systems reviewed and negative   General: Affect appropriate Healthy:  appears stated age HEENT: normal Neck supple with no adenopathy JVP normal no bruits no thyromegaly Lungs clear with no wheezing and good diaphragmatic motion Heart:  S1/S2 midl AS  murmur,rub, gallop or click PMI normal Abdomen: benighn, BS positve, no tenderness, no AAA no bruit.  No HSM or HJR Distal pulses intact with no bruits No edema Neuro non-focal Skin warm and dry No muscular weakness Boot on left foot with amputation 3rd toe S/P bilateral knee replacements  Medications Current Outpatient Prescriptions  Medication Sig Dispense Refill  . ALBUTEROL IN Inhale into the lungs as needed.        . ALPHA LIPOIC ACID PO Take 600 mg by mouth.        Marland Kitchen aspirin 81 MG tablet Take 81 mg by mouth daily.        . AVAPRO 300 MG tablet Take 300 mg by mouth Daily.      . Calcium Carbonate-Vitamin D (CALCIUM + D PO) Take by mouth 2 (two) times daily.        . carvedilol (COREG) 12.5 MG tablet Take 12.5 mg by mouth 2 (two) times daily with a meal. 1/2 TO 1 TABLET BID       . Cholecalciferol (VITAMIN D PO) Take 2,000 Units by  mouth daily.        . Coenzyme Q10 (COQ-10) 100 MG CAPS Take by mouth daily.        Marland Kitchen esomeprazole (NEXIUM) 40 MG capsule Take 40 mg by mouth daily.        . fish oil-omega-3 fatty acids 1000 MG capsule Take 2 g by mouth 2 (two) times daily.        . furosemide (LASIX) 20 MG tablet Take 20 mg by mouth as needed.        . hydrochlorothiazide 25 MG tablet Take 25 mg by mouth Daily.      . irbesartan-hydrochlorothiazide (AVALIDE) 300-25 MG per tablet Take 1 tablet by mouth daily.        . metFORMIN (GLUCOPHAGE) 500 MG tablet Take 500 mg by mouth 2 (two) times daily with a meal.        . Multiple Vitamin (MULTIVITAMIN) tablet Take 1 tablet by mouth daily.        Marland Kitchen omeprazole (PRILOSEC) 20 MG capsule Take 20 mg by mouth Every other day.      . peg 3350 powder (MOVIPREP) 100 G SOLR moviprep as directed  1 kit  0  . potassium chloride SA (K-DUR,KLOR-CON) 20 MEQ tablet Take 20 mEq by mouth 2 (two) times daily.        Marland Kitchen  ranolazine (RANEXA) 500 MG 12 hr tablet Take 500 mg by mouth daily.        . rosuvastatin (CRESTOR) 5 MG tablet Take 5 mg by mouth daily.        . simvastatin (ZOCOR) 10 MG tablet Take 10 mg by mouth at bedtime.         Current Facility-Administered Medications  Medication Dose Route Frequency Provider Last Rate Last Dose  . 0.9 %  sodium chloride infusion  500 mL Intravenous Continuous Yancey Flemings, MD        Allergies Ceclor; Penicillins; Dilaudid; Hydrocodone; Lyrica; Neurontin; Oxycodone; Robaxin; Streptomycin; Terramycin; Vicodin; Sulfa drugs cross reactors; and Zocor  Family History: Family History  Problem Relation Age of Onset  . Alzheimer's disease Father   . Heart failure Father   . Coronary artery disease Father   . Diabetes Father   . Coronary artery disease Mother   . Hypertension Mother   . Stroke Mother   . Diabetes Mother   . Coronary artery disease Brother   . Diabetes Brother   . Hypertension Brother   . Heart attack Sister   . Diabetes Sister   .  Hypertension Sister   . Stroke Daughter   . Deep vein thrombosis Son     Social History: History   Social History  . Marital Status: Married    Spouse Name: N/A    Number of Children: N/A  . Years of Education: N/A   Occupational History  . Not on file.   Social History Main Topics  . Smoking status: Never Smoker   . Smokeless tobacco: Not on file  . Alcohol Use: No  . Drug Use: Not on file  . Sexually Active: Not on file   Other Topics Concern  . Not on file   Social History Narrative  . No narrative on file    Electrocardiogram:  Assessment and Plan

## 2011-05-26 NOTE — Assessment & Plan Note (Signed)
Needs ABI's and echo to clear for surgery.  Should be ok

## 2011-05-26 NOTE — Assessment & Plan Note (Signed)
Well controlled.  Continue current medications and low sodium Dash type diet.    

## 2011-06-12 ENCOUNTER — Ambulatory Visit (HOSPITAL_COMMUNITY): Payer: Medicare Other | Attending: Cardiovascular Disease | Admitting: Radiology

## 2011-06-12 ENCOUNTER — Encounter (INDEPENDENT_AMBULATORY_CARE_PROVIDER_SITE_OTHER): Payer: Medicare Other | Admitting: *Deleted

## 2011-06-12 DIAGNOSIS — I10 Essential (primary) hypertension: Secondary | ICD-10-CM | POA: Insufficient documentation

## 2011-06-12 DIAGNOSIS — I359 Nonrheumatic aortic valve disorder, unspecified: Secondary | ICD-10-CM

## 2011-06-12 DIAGNOSIS — I080 Rheumatic disorders of both mitral and aortic valves: Secondary | ICD-10-CM | POA: Insufficient documentation

## 2011-06-12 DIAGNOSIS — I350 Nonrheumatic aortic (valve) stenosis: Secondary | ICD-10-CM

## 2011-06-12 DIAGNOSIS — E119 Type 2 diabetes mellitus without complications: Secondary | ICD-10-CM | POA: Insufficient documentation

## 2011-06-12 DIAGNOSIS — E1159 Type 2 diabetes mellitus with other circulatory complications: Secondary | ICD-10-CM

## 2011-06-12 DIAGNOSIS — L97509 Non-pressure chronic ulcer of other part of unspecified foot with unspecified severity: Secondary | ICD-10-CM

## 2011-06-12 DIAGNOSIS — Z01810 Encounter for preprocedural cardiovascular examination: Secondary | ICD-10-CM

## 2011-06-12 DIAGNOSIS — I079 Rheumatic tricuspid valve disease, unspecified: Secondary | ICD-10-CM | POA: Insufficient documentation

## 2011-07-03 LAB — BASIC METABOLIC PANEL
BUN: 11
BUN: 18
Calcium: 8.6
Creatinine, Ser: 0.88
GFR calc Af Amer: >60
GFR calc non Af Amer: >60
GFR calc non Af Amer: >60
Glucose, Bld: 131 — ABNORMAL HIGH
Potassium: 3.9
Sodium: 139

## 2011-07-03 LAB — PROTIME-INR
INR: 1
Prothrombin Time: 13.3

## 2011-07-03 LAB — URINALYSIS, ROUTINE W REFLEX MICROSCOPIC
Bilirubin Urine: NEGATIVE
Glucose, UA: NEGATIVE
Nitrite: NEGATIVE
Specific Gravity, Urine: 1.01
pH: 6.5

## 2011-07-03 LAB — DIFFERENTIAL
Lymphocytes Relative: 30
Lymphs Abs: 1.6
Neutrophils Relative %: 56

## 2011-07-03 LAB — CBC
HCT: 38.9
Platelets: 224
RBC: 4.61
WBC: 5.2

## 2011-07-03 LAB — TYPE AND SCREEN: Antibody Screen: NEGATIVE

## 2011-07-25 ENCOUNTER — Telehealth: Payer: Self-pay | Admitting: *Deleted

## 2011-07-25 NOTE — Telephone Encounter (Signed)
PT AWARE OF ECHO RESULTS FROM 06-12-11  ALSO PT REQUEST COPY SENT TO DR PERINI COPY SENT VIA EPIC .Zack Seal

## 2012-03-05 ENCOUNTER — Ambulatory Visit (INDEPENDENT_AMBULATORY_CARE_PROVIDER_SITE_OTHER): Payer: Medicare Other | Admitting: Nurse Practitioner

## 2012-03-05 ENCOUNTER — Encounter: Payer: Self-pay | Admitting: Nurse Practitioner

## 2012-03-05 VITALS — BP 147/79 | HR 84 | Ht 70.0 in | Wt 221.0 lb

## 2012-03-05 DIAGNOSIS — M6240 Contracture of muscle, unspecified site: Secondary | ICD-10-CM

## 2012-03-05 DIAGNOSIS — I350 Nonrheumatic aortic (valve) stenosis: Secondary | ICD-10-CM

## 2012-03-05 DIAGNOSIS — I359 Nonrheumatic aortic valve disorder, unspecified: Secondary | ICD-10-CM

## 2012-03-05 DIAGNOSIS — M6700 Short Achilles tendon (acquired), unspecified ankle: Secondary | ICD-10-CM

## 2012-03-05 NOTE — Progress Notes (Signed)
Patient Name: [name] Date of Encounter: 03/05/2012  Primary Care Provider:  Ezequiel Kayser, MD, MD Primary Cardiologist:  P. Eden Emms, MD  Patient Profile  71 y/o female w/ h/o mild AS who presents for pre-op eval.  Problem List   Past Medical History  Diagnosis Date  . Mild aortic stenosis     a. 06/2011 Echo EF 55-60%, Mild AI/AS,   . Morbid obesity   . HTN (hypertension)   . Osteoporosis   . Diabetes mellitus   . Diaphragm dysfunction   . DOE (dyspnea on exertion)   . Hiatal hernia   . GERD (gastroesophageal reflux disease)   . Osteoarthritis   . Asthma   . Palpitations   . Hyperlipidemia   . 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 Total knee arthroplasty     BILATERAL  . Appendectomy   . Bilateral oophorectomy   . Finger surgery     LEFT MIDDLE FINGER  . Mass excision     BACK  . Dilation and curettage of uterus     SEVERAL  . Biopsy thyroid   . Bilateral knee replacements   . Left shoulder replacement   . Right shoulder repair   . Left fot 3rd toe surgery   . Amputation left 3rd toe     Allergies  Allergies  Allergen Reactions  . Ceclor (Cefaclor) Anaphylaxis, Hives and Nausea And Vomiting  . Penicillins Anaphylaxis and Rash  . Dilaudid (Hydromorphone Hcl) Hives  . Hydrocodone Nausea And Vomiting and Other (See Comments)    DISORIENTED  . Lyrica (Pregabalin) Swelling  . Neurontin (Gabapentin) Swelling  . Oxycodone Nausea And Vomiting and Other (See Comments)    DISORIENTED  . Robaxin (Methocarbamol) Nausea And Vomiting  . Streptomycin Hives and Nausea And Vomiting  . Terramycin Nausea And Vomiting  . Terramycin (Oxytetracycline)     Nausea (Plus E Myocin)  . Vicodin (Hydrocodone-Acetaminophen) Hives  . Sulfa Drugs Cross Reactors Rash  . Zocor (Simvastatin - High Dose) Rash   HPI  71 y/o female with the above problem list.  She was last seen in clinic last fall  for pre-op clearance.  She was cleared but never had the surgery.  She has since changed surgeons and is now scheduled for surgery for bilat gastrocnemius release next Tuesday.  From a cardiac standpoint, she has done well.  She had been active up until about 6 wks ago when her foot/toe pain sidelined her.  Up to that point, she was using a recumbent bike and able to do routine housework without chest pain or dyspnea.  She denies pnd, orthopnea, n, v, dizziness, syncope, edema, or early satiety.  Home Medications  Prior to Admission medications   Medication Sig Start Date End Date Taking? Authorizing Provider  ALBUTEROL IN Inhale into the lungs as needed.     Yes Historical Provider, MD  AVAPRO 300 MG tablet Take 300 mg by mouth Daily. 12/31/10  Yes Historical Provider, MD  carvedilol (COREG) 12.5 MG tablet 1/2 tab po qd   Yes Historical Provider, MD  furosemide (LASIX) 20 MG tablet Take 20 mg by mouth as needed.     Yes Roger Shelter, MD  hydrochlorothiazide 25 MG tablet Take 25 mg by mouth Daily. 02/08/11  Yes Historical Provider, MD  metFORMIN (GLUCOPHAGE) 500 MG tablet Take 500 mg by mouth 2 (two) times daily with  a meal.     Yes Historical Provider, MD  omeprazole (PRILOSEC) 20 MG capsule Take 20 mg by mouth Every other day. 03/14/11  Yes Historical Provider, MD  potassium chloride SA (K-DUR,KLOR-CON) 20 MEQ tablet Take 20 mEq by mouth 2 (two) times daily.     Yes Roger Shelter, MD  rosuvastatin (CRESTOR) 5 MG tablet Take 5 mg by mouth daily.     Yes Historical Provider, MD  ALPHA LIPOIC ACID PO Take 600 mg by mouth.      Historical Provider, MD  aspirin 81 MG tablet Take 81 mg by mouth daily.      Historical Provider, MD  Calcium Carbonate-Vitamin D (CALCIUM + D PO) Take by mouth 2 (two) times daily.      Historical Provider, MD  Cholecalciferol (VITAMIN D PO) Take 2,000 Units by mouth daily.      Historical Provider, MD  Coenzyme Q10 (COQ-10) 100 MG CAPS Take by mouth daily.      Historical  Provider, MD  fish oil-omega-3 fatty acids 1000 MG capsule Take 2 g by mouth 2 (two) times daily.      Historical Provider, MD  ranolazine (RANEXA) 500 MG 12 hr tablet Take 500 mg by mouth daily.      Roger Shelter, MD   Review of Systems She has significant pain of her toes bilat related to her heel cord contractures.  She denies c/p or dyspnea.  Otherwise all other systems reviewed and are otherwise negative except as noted above.  Physical Exam  Blood pressure 147/79, pulse 84, height 5\' 10"  (1.778 m), weight 221 lb (100.245 kg).  General: Pleasant, NAD Psych: Normal affect. Neuro: Alert and oriented X 3. Moves all extremities spontaneously. HEENT: Normal  Neck: Supple without bruits or JVD. Lungs:  Resp regular and unlabored, CTA. Heart: RRR no s3, s4.  Soft SEM @ RUSB. Abdomen: Soft, non-tender, non-distended, BS + x 4.  Extremities: No clubbing, cyanosis or edema. DP/PT/Radials 2+ and equal bilaterally.  Accessory Clinical Findings  ECG - rsr, 82, LAD, poor r wave progression - no acute st/t changes.  Assessment & Plan  1.  Mild Aortic Stenosis/Regurgitation:  Last echo 06/2011.  NL EF.  Stable AS/AI.  She is pending surgery and is asymptomatic from a cardiac standpoint.  She is at acceptable risk for surgery.  Rec continuing bb/statin throughout peri-operative period.  2.  HTN:  Cont home meds.  3.  Heel Cord Contractures:  For surgery next Tuesday as above.  4.  DM:  Per IM.  5.  Dispo:  F/U Dr. Eden Emms in 6 months.  Nicolasa Ducking, NP 03/05/2012, 4:17 PM

## 2012-03-05 NOTE — Patient Instructions (Signed)
Your physician recommends that you schedule a follow-up appointment in: 6 months with Dr. Nishan 

## 2012-07-22 ENCOUNTER — Encounter (HOSPITAL_COMMUNITY): Payer: Self-pay | Admitting: Pharmacy Technician

## 2012-07-26 ENCOUNTER — Encounter (HOSPITAL_COMMUNITY)
Admission: RE | Admit: 2012-07-26 | Discharge: 2012-07-26 | Disposition: A | Discharge status: Home / Self Care | Payer: Medicare Other | Source: Ambulatory Visit | Attending: Orthopedic Surgery | Admitting: Orthopedic Surgery

## 2012-07-26 ENCOUNTER — Encounter (HOSPITAL_COMMUNITY): Payer: Self-pay

## 2012-07-26 ENCOUNTER — Ambulatory Visit (HOSPITAL_COMMUNITY)
Admission: RE | Admit: 2012-07-26 | Discharge: 2012-07-26 | Disposition: A | Discharge status: Home / Self Care | Payer: Medicare Other | Source: Ambulatory Visit | Attending: Orthopedic Surgery | Admitting: Orthopedic Surgery

## 2012-07-26 ENCOUNTER — Other Ambulatory Visit (HOSPITAL_COMMUNITY): Payer: Self-pay | Admitting: Orthopedic Surgery

## 2012-07-26 DIAGNOSIS — J984 Other disorders of lung: Secondary | ICD-10-CM | POA: Insufficient documentation

## 2012-07-26 DIAGNOSIS — I712 Thoracic aortic aneurysm, without rupture, unspecified: Secondary | ICD-10-CM | POA: Insufficient documentation

## 2012-07-26 DIAGNOSIS — Z01818 Encounter for other preprocedural examination: Secondary | ICD-10-CM | POA: Insufficient documentation

## 2012-07-26 DIAGNOSIS — Z01812 Encounter for preprocedural laboratory examination: Secondary | ICD-10-CM | POA: Insufficient documentation

## 2012-07-26 HISTORY — DX: Nontoxic multinodular goiter: E04.2

## 2012-07-26 HISTORY — DX: Diverticulosis of intestine, part unspecified, without perforation or abscess without bleeding: K57.90

## 2012-07-26 LAB — CBC
HCT: 40.9 % (ref 36.0–46.0)
MCHC: 33 g/dL (ref 30.0–36.0)
MCV: 81.5 fL (ref 78.0–100.0)
RDW: 14.7 % (ref 11.5–15.5)

## 2012-07-26 LAB — PROTIME-INR
INR: 1.1 (ref 0.00–1.49)
Prothrombin Time: 14.1 seconds (ref 11.6–15.2)

## 2012-07-26 LAB — COMPREHENSIVE METABOLIC PANEL
Albumin: 3.9 g/dL (ref 3.5–5.2)
BUN: 20 mg/dL (ref 6–23)
Creatinine, Ser: 0.71 mg/dL (ref 0.50–1.10)
Total Protein: 7.3 g/dL (ref 6.0–8.3)

## 2012-07-26 NOTE — Progress Notes (Addendum)
1420  sPOKE WITH aLLISON ALBOUT mS. Rizor'S  AORTIC STENOSIS.the patient HAS HAD NO PROBLEMS, AND HAS SEEN DR P NISHAN LAST YR  .Marland KitchenECHO DONE SEPT. 2012...Marland KitchenMarland KitchenDA

## 2012-07-26 NOTE — Pre-Procedure Instructions (Signed)
20 [name]   07/26/2012   Your procedure is scheduled on: October 23rd, Wednesday   Report to [facility] Stay [facility] at 6:30 AM.   Call this number if you have problems the morning of surgery: [phone]   Remember:   Do not eat food or drink any liquids:After Midnight  Tuesday.               May take this medications with a sip of water the MORNING of Surgery--Inhaler,             Coreg, Omeprazole   Do not wear jewelry, make-up or nail polish.  Do not wear lotions, powders, or perfumes. You may NOT wear deodorant.  Do not shave 48 hours prior to surgery.    Do not bring valuables to the hospital.  Contacts, dentures or bridgework may not be worn into surgery.   Leave suitcase in the car. After surgery it may be brought to your room.  For patients admitted to the hospital, checkout time is 11:00 AM the day of discharge.   Patients discharged the day of surgery will not be allowed to drive home.   Name and phone number of your driver:     Special Instructions: Shower using CHG 2 nights before surgery and the night before surgery.  If you shower the day of surgery use CHG.  Use special wash - you have one bottle of CHG for all showers.  You should use approximately 1/3 of the bottle for each shower.   Please read over the following fact sheets that you were given: Pain Booklet, MRSA Information and Surgical Site Infection Prevention

## 2012-07-27 MED ORDER — CHLORHEXIDINE GLUCONATE 4 % EX LIQD: 60.0000 mL | Freq: Once | CUTANEOUS | Status: DC

## 2012-07-27 MED ORDER — CLINDAMYCIN PHOSPHATE 900 MG/50ML IV SOLN
900.0000 mg | INTRAVENOUS | Status: AC
  Administered 2012-07-28: 900 mg via INTRAVENOUS
  Filled 2012-07-27: qty 50

## 2012-07-27 NOTE — Consult Note (Signed)
Anesthesia chart review: Patient is a 71 year old female scheduled for right great toe amputation on 10/23/2013by Dr. Lajoyce Corners.  History includes nonsmoker, mild aortic stenosis by echo 06/2011, hypertension, obesity, osteoporosis, GERD, asthma, hyperlipidemia, diabetes mellitus type 2, hiatal hernia, thyroid nodules, multiple prior surgeries.  PCP is Dr. Rodrigo Ran.    Cardiologist is Dr. Eden Emms.  She was last seen by [facility] NP Ward Givens on 03/05/12 for a pre-operative evaluation for bilateral gastrocnemius release.  EKG then showed NSR, possible LAE, LAD, poor r wave progression. She was ultimately cleared for that procedure with six month follow-up recommended.    Echo on 06/12/11 showed: - Left ventricle: The cavity size was normal. Wall thickness was normal. Systolic function was normal. The estimated ejection fraction was in the range of 55% to 60%. - Aortic valve: Moderately calcified annulus. There was mild stenosis. Mild regurgitation. VTI ratio of LVOT to aortic valve: 0.45. Indexed valve area: 0.79cm^2/m^2 (VTI). Peak velocity ratio of LVOT to aortic valve: 0.41. Indexed valve area: 0.72cm^2/m^2 (Vmax). Mean gradient: 12mm Hg (S). Peak gradient: 23mm Hg (S). - Mitral valve: Calcified annulus. Mildly thickened leaflets . - Atrial septum: No defect or patent foramen ovale was identified. - Tricuspid valve: Mild regurgitation.  CXR on 07/26/12 showed: 1. Low lung volumes without radiographic evidence of acute  cardiopulmonary disease.  2. Atherosclerosis in the thoracic aorta..  3. Sequelae of old granulomatous disease, as above (see complete report).  Labs noted.  Her surgery is scheduled for tomorrow.  She has been evaluated at [facility] within the past five months and was cleared for a surgical procedure at that time.  She had an echo thirteen months ago that showed stable mild AS with normal EF.  She will be evaluated by her Anesthesiologist on the day of surgery,  but would anticipate if she is asymptomatic from a CV standpoint then she could proceed as planned.  Shonna Chock, PA-C 07/27/12 [phone]

## 2012-07-28 ENCOUNTER — Ambulatory Visit (HOSPITAL_COMMUNITY): Payer: Medicare Other | Admitting: Vascular Surgery

## 2012-07-28 ENCOUNTER — Encounter (HOSPITAL_COMMUNITY): Payer: Self-pay | Admitting: Vascular Surgery

## 2012-07-28 ENCOUNTER — Encounter (HOSPITAL_COMMUNITY): Payer: Self-pay | Admitting: Surgery

## 2012-07-28 ENCOUNTER — Ambulatory Visit (HOSPITAL_COMMUNITY)
Admission: RE | Admit: 2012-07-28 | Discharge: 2012-07-29 | Disposition: A | Discharge status: Home / Self Care | Payer: Medicare Other | Source: Ambulatory Visit | Attending: Orthopedic Surgery | Admitting: Orthopedic Surgery

## 2012-07-28 ENCOUNTER — Encounter (HOSPITAL_COMMUNITY)
Admission: RE | Disposition: A | Discharge status: Home / Self Care | Payer: Self-pay | Source: Ambulatory Visit | Attending: Orthopedic Surgery

## 2012-07-28 DIAGNOSIS — K219 Gastro-esophageal reflux disease without esophagitis: Secondary | ICD-10-CM | POA: Insufficient documentation

## 2012-07-28 DIAGNOSIS — M1990 Unspecified osteoarthritis, unspecified site: Secondary | ICD-10-CM | POA: Insufficient documentation

## 2012-07-28 DIAGNOSIS — E785 Hyperlipidemia, unspecified: Secondary | ICD-10-CM | POA: Insufficient documentation

## 2012-07-28 DIAGNOSIS — I359 Nonrheumatic aortic valve disorder, unspecified: Secondary | ICD-10-CM | POA: Insufficient documentation

## 2012-07-28 DIAGNOSIS — Z23 Encounter for immunization: Secondary | ICD-10-CM | POA: Insufficient documentation

## 2012-07-28 DIAGNOSIS — M869 Osteomyelitis, unspecified: Secondary | ICD-10-CM | POA: Insufficient documentation

## 2012-07-28 DIAGNOSIS — M810 Age-related osteoporosis without current pathological fracture: Secondary | ICD-10-CM | POA: Insufficient documentation

## 2012-07-28 DIAGNOSIS — M9080 Osteopathy in diseases classified elsewhere, unspecified site: Secondary | ICD-10-CM | POA: Insufficient documentation

## 2012-07-28 DIAGNOSIS — E1169 Type 2 diabetes mellitus with other specified complication: Secondary | ICD-10-CM | POA: Insufficient documentation

## 2012-07-28 DIAGNOSIS — I10 Essential (primary) hypertension: Secondary | ICD-10-CM | POA: Insufficient documentation

## 2012-07-28 HISTORY — DX: Chronic kidney disease, unspecified: N18.9

## 2012-07-28 HISTORY — PX: TOE AMPUTATION: SHX809

## 2012-07-28 HISTORY — PX: AMPUTATION: SHX166

## 2012-07-28 LAB — GLUCOSE, CAPILLARY
Glucose-Capillary: 158 mg/dL — ABNORMAL HIGH (ref 70–99)
Glucose-Capillary: 135 mg/dL — ABNORMAL HIGH (ref 70–99)

## 2012-07-28 SURGERY — AMPUTATION DIGIT
Anesthesia: General | Site: Foot | Laterality: Right | Wound class: Dirty or Infected

## 2012-07-28 MED ORDER — PNEUMOCOCCAL VAC POLYVALENT 25 MCG/0.5ML IJ INJ
0.5000 mL | INJECTION | INTRAMUSCULAR | Status: AC
  Administered 2012-07-29: 0.5 mL via INTRAMUSCULAR
  Filled 2012-07-28 (×2): qty 0.5

## 2012-07-28 MED ORDER — METOCLOPRAMIDE HCL 5 MG/ML IJ SOLN: 5.0000 mg | Freq: Three times a day (TID) | INTRAMUSCULAR | Status: DC | PRN

## 2012-07-28 MED ORDER — FENTANYL CITRATE 0.05 MG/ML IJ SOLN
INTRAMUSCULAR | Status: DC | PRN
  Administered 2012-07-28: 50 ug via INTRAVENOUS

## 2012-07-28 MED ORDER — HYDROCHLOROTHIAZIDE 25 MG PO TABS
25.0000 mg | ORAL_TABLET | Freq: Every day | ORAL | Status: DC
  Administered 2012-07-28 – 2012-07-29 (×2): 25 mg via ORAL
  Filled 2012-07-28 (×2): qty 1

## 2012-07-28 MED ORDER — PROPOFOL 10 MG/ML IV BOLUS
INTRAVENOUS | Status: DC | PRN
  Administered 2012-07-28: 150 mg via INTRAVENOUS

## 2012-07-28 MED ORDER — CLINDAMYCIN PHOSPHATE 600 MG/50ML IV SOLN
600.0000 mg | Freq: Four times a day (QID) | INTRAVENOUS | Status: AC
  Administered 2012-07-28 – 2012-07-29 (×3): 600 mg via INTRAVENOUS
  Filled 2012-07-28 (×3): qty 50

## 2012-07-28 MED ORDER — FUROSEMIDE 20 MG PO TABS
10.0000 mg | ORAL_TABLET | Freq: Every day | ORAL | Status: DC
Start: 2012-07-28 — End: 2012-07-29
  Administered 2012-07-28 – 2012-07-29 (×2): 10 mg via ORAL
  Filled 2012-07-28 (×2): qty 0.5

## 2012-07-28 MED ORDER — TRAMADOL HCL 50 MG PO TABS
50.0000 mg | ORAL_TABLET | Freq: Four times a day (QID) | ORAL | Status: DC | PRN
  Administered 2012-07-28 – 2012-07-29 (×2): 50 mg via ORAL
  Filled 2012-07-28 (×2): qty 1

## 2012-07-28 MED ORDER — LACTATED RINGERS IV SOLN
INTRAVENOUS | Status: DC | PRN
  Administered 2012-07-28: 08:00:00 via INTRAVENOUS

## 2012-07-28 MED ORDER — METFORMIN HCL 500 MG PO TABS
500.0000 mg | ORAL_TABLET | Freq: Two times a day (BID) | ORAL | Status: DC
  Administered 2012-07-28 – 2012-07-29 (×2): 500 mg via ORAL
  Filled 2012-07-28 (×4): qty 1

## 2012-07-28 MED ORDER — ONDANSETRON HCL 4 MG/2ML IJ SOLN: 4.0000 mg | Freq: Four times a day (QID) | INTRAMUSCULAR | Status: DC | PRN

## 2012-07-28 MED ORDER — ARTIFICIAL TEARS OP OINT
TOPICAL_OINTMENT | OPHTHALMIC | Status: DC | PRN
  Administered 2012-07-28: 1 via OPHTHALMIC

## 2012-07-28 MED ORDER — CARVEDILOL 12.5 MG PO TABS
6.2500 mg | ORAL_TABLET | Freq: Every day | ORAL | Status: DC
  Administered 2012-07-28: 6.25 mg via ORAL
  Filled 2012-07-28 (×2): qty 0.5

## 2012-07-28 MED ORDER — ASPIRIN EC 81 MG PO TBEC
81.0000 mg | DELAYED_RELEASE_TABLET | Freq: Every day | ORAL | Status: DC
  Administered 2012-07-28: 81 mg via ORAL
  Filled 2012-07-28 (×2): qty 1

## 2012-07-28 MED ORDER — POTASSIUM CHLORIDE CRYS ER 20 MEQ PO TBCR
20.0000 meq | EXTENDED_RELEASE_TABLET | Freq: Two times a day (BID) | ORAL | Status: DC
  Administered 2012-07-28 – 2012-07-29 (×3): 20 meq via ORAL
  Filled 2012-07-28 (×4): qty 1

## 2012-07-28 MED ORDER — ATORVASTATIN CALCIUM 10 MG PO TABS
10.0000 mg | ORAL_TABLET | Freq: Every day | ORAL | Status: DC
  Administered 2012-07-28: 10 mg via ORAL
  Filled 2012-07-28 (×2): qty 1

## 2012-07-28 MED ORDER — PANTOPRAZOLE SODIUM 40 MG PO TBEC
40.0000 mg | DELAYED_RELEASE_TABLET | Freq: Every day | ORAL | Status: DC
  Filled 2012-07-28: qty 1

## 2012-07-28 MED ORDER — LACTATED RINGERS IV SOLN: INTRAVENOUS | Status: DC

## 2012-07-28 MED ORDER — ONDANSETRON HCL 4 MG PO TABS: 4.0000 mg | ORAL_TABLET | Freq: Four times a day (QID) | ORAL | Status: DC | PRN

## 2012-07-28 MED ORDER — LIDOCAINE HCL (CARDIAC) 20 MG/ML IV SOLN
INTRAVENOUS | Status: DC | PRN
  Administered 2012-07-28: 100 mg via INTRAVENOUS

## 2012-07-28 MED ORDER — ONDANSETRON HCL 4 MG/2ML IJ SOLN
INTRAMUSCULAR | Status: DC | PRN
  Administered 2012-07-28: 4 mg via INTRAVENOUS

## 2012-07-28 MED ORDER — PROMETHAZINE HCL 25 MG/ML IJ SOLN: 6.2500 mg | INTRAMUSCULAR | Status: DC | PRN

## 2012-07-28 MED ORDER — ALBUTEROL SULFATE HFA 108 (90 BASE) MCG/ACT IN AERS
2.0000 | INHALATION_SPRAY | Freq: Every day | RESPIRATORY_TRACT | Status: DC
  Administered 2012-07-28: 2 via RESPIRATORY_TRACT
  Filled 2012-07-28: qty 6.7

## 2012-07-28 MED ORDER — EPHEDRINE SULFATE 50 MG/ML IJ SOLN
INTRAMUSCULAR | Status: DC | PRN
  Administered 2012-07-28 (×2): 10 mg via INTRAVENOUS

## 2012-07-28 MED ORDER — IRBESARTAN 300 MG PO TABS
300.0000 mg | ORAL_TABLET | Freq: Every day | ORAL | Status: DC
  Administered 2012-07-28 – 2012-07-29 (×2): 300 mg via ORAL
  Filled 2012-07-28 (×2): qty 1

## 2012-07-28 MED ORDER — METOCLOPRAMIDE HCL 10 MG PO TABS
5.0000 mg | ORAL_TABLET | Freq: Three times a day (TID) | ORAL | Status: DC | PRN
  Administered 2012-07-29: 10 mg via ORAL
  Filled 2012-07-28: qty 1

## 2012-07-28 MED ORDER — MEPERIDINE HCL 25 MG/ML IJ SOLN: 6.2500 mg | INTRAMUSCULAR | Status: DC | PRN

## 2012-07-28 MED ORDER — INFLUENZA VIRUS VACC SPLIT PF IM SUSP
0.5000 mL | INTRAMUSCULAR | Status: AC
  Administered 2012-07-29: 0.5 mL via INTRAMUSCULAR
  Filled 2012-07-28 (×2): qty 0.5

## 2012-07-28 MED ORDER — SODIUM CHLORIDE 0.9 % IV SOLN
INTRAVENOUS | Status: DC
  Administered 2012-07-28: 11:00:00 via INTRAVENOUS

## 2012-07-28 MED ORDER — ASPIRIN 81 MG PO TABS: 81.0000 mg | ORAL_TABLET | Freq: Every day | ORAL | Status: DC

## 2012-07-28 MED ORDER — HYDROMORPHONE HCL PF 1 MG/ML IJ SOLN: 0.5000 mg | INTRAMUSCULAR | Status: DC | PRN

## 2012-07-28 MED ORDER — FENTANYL CITRATE 0.05 MG/ML IJ SOLN: 25.0000 ug | INTRAMUSCULAR | Status: DC | PRN

## 2012-07-28 SURGICAL SUPPLY — 50 items
BANDAGE GAUZE 4  KLING STR (GAUZE/BANDAGES/DRESSINGS) IMPLANT
BANDAGE GAUZE ELAST BULKY 4 IN (GAUZE/BANDAGES/DRESSINGS) ×1 IMPLANT
BLADE AVERAGE 25X9 (BLADE) IMPLANT
BLADE MINI RND TIP GREEN BEAV (BLADE) IMPLANT
BNDG CMPR 9X4 STRL LF SNTH (GAUZE/BANDAGES/DRESSINGS) ×1
BNDG COHESIVE 1X5 TAN STRL LF (GAUZE/BANDAGES/DRESSINGS) IMPLANT
BNDG COHESIVE 4X5 TAN STRL (GAUZE/BANDAGES/DRESSINGS) ×1 IMPLANT
BNDG COHESIVE 6X5 TAN STRL LF (GAUZE/BANDAGES/DRESSINGS) IMPLANT
BNDG ESMARK 4X9 LF (GAUZE/BANDAGES/DRESSINGS) ×2 IMPLANT
BNDG GAUZE STRTCH 6 (GAUZE/BANDAGES/DRESSINGS) IMPLANT
CLOTH BEACON ORANGE TIMEOUT ST (SAFETY) ×2 IMPLANT
CORDS BIPOLAR (ELECTRODE) ×2 IMPLANT
COVER SURGICAL LIGHT HANDLE (MISCELLANEOUS) ×2 IMPLANT
CUFF TOURNIQUET SINGLE 18IN (TOURNIQUET CUFF) IMPLANT
CUFF TOURNIQUET SINGLE 24IN (TOURNIQUET CUFF) IMPLANT
CUFF TOURNIQUET SINGLE 34IN LL (TOURNIQUET CUFF) IMPLANT
CUFF TOURNIQUET SINGLE 44IN (TOURNIQUET CUFF) IMPLANT
DRAPE U-SHAPE 47X51 STRL (DRAPES) ×2 IMPLANT
DRSG ADAPTIC 3X8 NADH LF (GAUZE/BANDAGES/DRESSINGS) IMPLANT
DRSG EMULSION OIL 3X3 NADH (GAUZE/BANDAGES/DRESSINGS) ×1 IMPLANT
DRSG PAD ABDOMINAL 8X10 ST (GAUZE/BANDAGES/DRESSINGS) ×1 IMPLANT
DURAPREP 26ML APPLICATOR (WOUND CARE) ×2 IMPLANT
ELECT REM PT RETURN 9FT ADLT (ELECTROSURGICAL) ×2
ELECTRODE REM PT RTRN 9FT ADLT (ELECTROSURGICAL) ×1 IMPLANT
GAUZE SPONGE 2X2 8PLY STRL LF (GAUZE/BANDAGES/DRESSINGS) IMPLANT
GLOVE SURG ORTHO 9.0 STRL STRW (GLOVE) ×2 IMPLANT
GOWN PREVENTION PLUS XLARGE (GOWN DISPOSABLE) ×2 IMPLANT
GOWN SRG XL XLNG 56XLVL 4 (GOWN DISPOSABLE) ×1 IMPLANT
GOWN STRL NON-REIN XL XLG LVL4 (GOWN DISPOSABLE) ×2
KIT BASIN OR (CUSTOM PROCEDURE TRAY) ×2 IMPLANT
KIT ROOM TURNOVER OR (KITS) ×2 IMPLANT
MANIFOLD NEPTUNE II (INSTRUMENTS) ×2 IMPLANT
NDL HYPO 25GX1X1/2 BEV (NEEDLE) IMPLANT
NEEDLE HYPO 25GX1X1/2 BEV (NEEDLE) IMPLANT
NS IRRIG 1000ML POUR BTL (IV SOLUTION) ×2 IMPLANT
PACK ORTHO EXTREMITY (CUSTOM PROCEDURE TRAY) ×2 IMPLANT
PAD ARMBOARD 7.5X6 YLW CONV (MISCELLANEOUS) ×4 IMPLANT
PAD CAST 4YDX4 CTTN HI CHSV (CAST SUPPLIES) IMPLANT
PADDING CAST COTTON 4X4 STRL (CAST SUPPLIES)
SPECIMEN JAR SMALL (MISCELLANEOUS) ×2 IMPLANT
SPONGE GAUZE 2X2 STER 10/PKG (GAUZE/BANDAGES/DRESSINGS)
SPONGE GAUZE 4X4 12PLY (GAUZE/BANDAGES/DRESSINGS) ×1 IMPLANT
SUCTION FRAZIER TIP 10 FR DISP (SUCTIONS) IMPLANT
SUT ETHILON 2 0 PSLX (SUTURE) ×1 IMPLANT
SUT VIC AB 2-0 FS1 27 (SUTURE) IMPLANT
SYR CONTROL 10ML LL (SYRINGE) IMPLANT
TOWEL OR 17X24 6PK STRL BLUE (TOWEL DISPOSABLE) ×2 IMPLANT
TOWEL OR 17X26 10 PK STRL BLUE (TOWEL DISPOSABLE) ×2 IMPLANT
TUBE CONNECTING 12X1/4 (SUCTIONS) IMPLANT
WATER STERILE IRR 1000ML POUR (IV SOLUTION) ×2 IMPLANT

## 2012-07-28 NOTE — Anesthesia Preprocedure Evaluation (Addendum)
Anesthesia Evaluation  Patient identified by MRN, date of birth, ID band Patient awake    Reviewed: Allergy & Precautions, H&P , NPO status , Patient's Chart, lab work & pertinent test results, reviewed documented beta blocker date and time   Airway Mallampati: II TM Distance: >3 FB Neck ROM: Full    Dental No notable dental hx. (+) Teeth Intact, Caps and Dental Advidsory Given   Pulmonary shortness of breath and with exertion, asthma ,  breath sounds clear to auscultation  Pulmonary exam normal       Cardiovascular hypertension, On Home Beta Blockers Rhythm:Regular Rate:Normal     Neuro/Psych  Neuromuscular disease    GI/Hepatic hiatal hernia, GERD-  Medicated and Controlled,  Endo/Other  diabetes, Well Controlled, Type 2  Renal/GU      Musculoskeletal   Abdominal   Peds  Hematology   Anesthesia Other Findings   Reproductive/Obstetrics                         Anesthesia Physical Anesthesia Plan  ASA: III  Anesthesia Plan: General   Post-op Pain Management:    Induction: Intravenous  Airway Management Planned: LMA  Additional Equipment:   Intra-op Plan:   Post-operative Plan: Extubation in OR  Informed Consent: I have reviewed the patients History and Physical, chart, labs and discussed the procedure including the risks, benefits and alternatives for the proposed anesthesia with the patient or authorized representative who has indicated his/her understanding and acceptance.   Dental Advisory Given  Plan Discussed with: Anesthesiologist, CRNA and Surgeon  Anesthesia Plan Comments:        Anesthesia Quick Evaluation

## 2012-07-28 NOTE — Transfer of Care (Signed)
Immediate Anesthesia Transfer of Care Note  Patient: [name]  Procedure(s) Performed: Procedure(s) (LRB) with comments: AMPUTATION DIGIT (Right) - Right Great Toe Amputation  Patient Location: PACU  Anesthesia Type: General  Level of Consciousness: awake, alert  and oriented  Airway & Oxygen Therapy: Patient Spontanous Breathing and Patient connected to nasal cannula oxygen  Post-op Assessment: Report given to PACU RN, Post -op Vital signs reviewed and stable and Patient moving all extremities X 4  Post vital signs: Reviewed and stable  Complications: No apparent anesthesia complications

## 2012-07-28 NOTE — Plan of Care (Signed)
Problem: Consults Goal: Lower Extremity Amputation Patient Education See Patient Education Module for education specifics. Outcome: Progressing Toe amputation on the right foot

## 2012-07-28 NOTE — Op Note (Signed)
OPERATIVE REPORT  DATE OF SURGERY: 07/28/2012  PATIENT:  [name],  71 y.o. 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 She has failed conservative treatment and presents at this time for surgical intervention. Risks and benefits were discussed including persistent infection need for additional surgery. Patient states she understands and wishes to proceed at this time. Description of procedure patient was brought to the operating room and underwent a general anesthetic. After adequate levels of anesthesia were obtained patient's right lower extremity was prepped using DuraPrep and draped into a sterile field. A fishmouth incision was made distal to the MTP joint right great toe the toe was amputated through the MTP joint. The wound was cleansed hemostasis was obtained the incision was closed using 2-0 nylon. The wound was covered with Adaptic orthopedic sponges AB dressing Kerlix and Coban. Patient was extubated taken to the PACU in stable condition plan for overnight observation.  PLAN OF CARE: Admit for overnight observation  PATIENT DISPOSITION:  PACU - hemodynamically stable.   Nadara Mustard, MD 07/28/2012 9:11 AM

## 2012-07-28 NOTE — Anesthesia Postprocedure Evaluation (Signed)
  Anesthesia Post-op Note  Patient: [name]  Procedure(s) Performed: Procedure(s) (LRB): AMPUTATION DIGIT (Right)  Patient Location: PACU  Anesthesia Type: General  Level of Consciousness: awake and alert   Airway and Oxygen Therapy: Patient Spontanous Breathing  Post-op Pain: mild  Post-op Assessment: Post-op Vital signs reviewed, Patient's Cardiovascular Status Stable, Respiratory Function Stable, Patent Airway and No signs of Nausea or vomiting  Post-op Vital Signs: stable  Complications: No apparent anesthesia complications

## 2012-07-28 NOTE — Progress Notes (Signed)
Orthopedic Tech Progress Note Patient Details:  [name] 11/04/1940 [id number]  Ortho Devices Type of Ortho Device: Other (comment) (darco shoe) Ortho Device/Splint Interventions: Application   [name] 07/28/2012, 3:06 PM

## 2012-07-28 NOTE — H&P (Signed)
[name] is an 71 y.o. female.   Chief Complaint: Abscess osteomyelitis right great toe HPI: Patient is a 71 year old woman who has had progressive ulcerative changes and osteomyelitis of the great toe she has failed conservative treatment and presents at this time for surgical intervention.  Past Medical History  Diagnosis Date  . Mild aortic stenosis     a. 06/2011 Echo EF 55-60%, Mild AI/AS,   . Morbid obesity   . HTN (hypertension)   . Osteoporosis   . Diaphragm dysfunction   . DOE (dyspnea on exertion)   . Hiatal hernia   . GERD (gastroesophageal reflux disease)   . Osteoarthritis   . Asthma   . Palpitations   . Hyperlipidemia   . 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 Multiple thyroid nodules     have been drained x 1  . Diabetes mellitus     dx 10 yrs ago  . Diverticulosis     Past Surgical History  Procedure Date  . Total knee arthroplasty     BILATERAL  . Appendectomy   . Bilateral oophorectomy   . Finger surgery     LEFT MIDDLE FINGER  . Mass excision     BACK  . Dilation and curettage of uterus     SEVERAL  . Biopsy thyroid   . Bilateral knee replacements   . Left shoulder replacement   . Right shoulder repair   . Left fot 3rd toe surgery   . Amputation left 3rd toe   . Joint replacement     bilateral total knees  . Bilateral cataracts     Family History  Problem Relation Age of Onset  . Alzheimer's disease Father   . Heart failure Father   . Coronary artery disease Father   . Diabetes Father   . Coronary artery disease Mother   . Hypertension Mother   . Stroke Mother   . Diabetes Mother   . Coronary artery disease Brother   . Diabetes Brother   . Hypertension Brother   . Heart attack Sister   . Diabetes Sister   . Hypertension Sister   . Stroke Daughter   . Deep vein thrombosis Son    Social History:  reports that she has never smoked. She does not have any smokeless tobacco history on file. She  reports that she does not drink alcohol or use illicit drugs.  Allergies:  Allergies  Allergen Reactions  . Ceclor (Cefaclor) Anaphylaxis, Hives and Nausea And Vomiting  . Latex Swelling  . Penicillins Anaphylaxis and Rash  . Dilaudid (Hydromorphone Hcl) Hives  . Hydrocodone Nausea And Vomiting and Other (See Comments)    DISORIENTED  . Lyrica (Pregabalin) Swelling  . Neurontin (Gabapentin) Swelling  . Oxycodone Nausea And Vomiting and Other (See Comments)    DISORIENTED  . Robaxin (Methocarbamol) Nausea And Vomiting  . Streptomycin Hives and Nausea And Vomiting  . Terramycin Nausea And Vomiting  . Terramycin (Oxytetracycline)     Nausea (Plus E Myocin)  . Vicodin (Hydrocodone-Acetaminophen) Hives  . Sulfa Drugs Cross Reactors Rash  . Zocor (Simvastatin - High Dose) Rash    No prescriptions prior to admission    Results for orders placed during the hospital encounter of 07/26/12 (from the past 48 hour(s))  SURGICAL PCR SCREEN     Status: Normal   Collection Time   07/26/12  2:11 PM      Component Value  Range Comment   MRSA, PCR NEGATIVE  NEGATIVE    Staphylococcus aureus NEGATIVE  NEGATIVE   APTT     Status: Normal   Collection Time   07/26/12  2:11 PM      Component Value Range Comment   aPTT 29  24 - 37 seconds   CBC     Status: Normal   Collection Time   07/26/12  2:11 PM      Component Value Range Comment   WBC 7.2  4.0 - 10.5 K/uL    RBC 5.02  3.87 - 5.11 MIL/uL    Hemoglobin 13.5  12.0 - 15.0 g/dL    HCT 16.1  09.6 - 04.5 %    MCV 81.5  78.0 - 100.0 fL    MCH 26.9  26.0 - 34.0 pg    MCHC 33.0  30.0 - 36.0 g/dL    RDW 40.9  81.1 - 91.4 %    Platelets 234  150 - 400 K/uL   COMPREHENSIVE METABOLIC PANEL     Status: Abnormal   Collection Time   07/26/12  2:11 PM      Component Value Range Comment   Sodium 142  135 - 145 mEq/L    Potassium 3.7  3.5 - 5.1 mEq/L    Chloride 102  96 - 112 mEq/L    CO2 28  19 - 32 mEq/L    Glucose, Bld 140 (*) 70 - 99 mg/dL      BUN 20  6 - 23 mg/dL    Creatinine, Ser 7.82  0.50 - 1.10 mg/dL    Calcium 95.6  8.4 - 10.5 mg/dL    Total Protein 7.3  6.0 - 8.3 g/dL    Albumin 3.9  3.5 - 5.2 g/dL    AST 13  0 - 37 U/L    ALT 19  0 - 35 U/L    Alkaline Phosphatase 75  39 - 117 U/L    Total Bilirubin 0.3  0.3 - 1.2 mg/dL    GFR calc non Af Amer 85 (*) >90 mL/min    GFR calc Af Amer >90  >90 mL/min   PROTIME-INR     Status: Normal   Collection Time   07/26/12  2:11 PM      Component Value Range Comment   Prothrombin Time 14.1  11.6 - 15.2 seconds    INR 1.10  0.00 - 1.49    Dg Chest 2 View  07/26/2012  *RADIOLOGY REPORT*  Clinical Data: Amputation.  CHEST - 2 VIEW  Comparison: Chest x-ray 01/25/2011.  Findings: Lung volumes are low.  No acute consolidative airspace disease.  No pleural effusions.  Small dense nodule projecting over the lateral aspect of the lower right hemithorax likely represents a calcified granuloma (unchanged).  No other larger more suspicious appearing pulmonary nodules or masses are otherwise identified. Heart size is normal.  Mediastinal contours are unremarkable. Atherosclerosis in the thoracic aorta.  Left shoulder hemiarthroplasty. Multiple calcified mediastinal lymph nodes are again noted, compatible with old granulomatous disease.  IMPRESSION: 1.  Low lung volumes without radiographic evidence of acute cardiopulmonary disease. 2.  Atherosclerosis. 3.  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   Original Report Authenticated By: [name], M.D.     Review of Systems  All other systems reviewed and are negative.    There were no vitals taken for this visit. Physical Exam  On examination patient has dopplerable pulses. She has ulceration with osteomyelitis  of the right great toe. Assessment/Plan Assessment: Ulceration osteomyelitis right great toe.  Plan: We'll plan for right toe amputation through the MTP joint. Risks and benefits were discussed including infection  neurovascular injury nonhealing of the wound need for higher level amputation. Patient states she understands and wished to proceed at this time.  [name] 07/28/2012, 6:30 AM

## 2012-07-28 NOTE — Preoperative (Signed)
Beta Blockers   Reason not to administer Beta Blockers:Not Applicable 

## 2012-07-28 NOTE — Progress Notes (Signed)
Orthopedic Tech Progress Note Patient Details:  [name] 1941-06-10 [id number]  Ortho Devices Type of Ortho Device: Other (comment) (darco shoe) Ortho Device/Splint Location: (R) LE Ortho Device/Splint Interventions: Application   [name] 07/28/2012, 3:07 PM

## 2012-07-28 NOTE — Anesthesia Procedure Notes (Signed)
Procedure Name: LMA Insertion Date/Time: 07/28/2012 8:43 AM Performed by: Carmela Rima Pre-anesthesia Checklist: Patient identified, Timeout performed, Emergency Drugs available, Suction available and Patient being monitored Patient Re-evaluated:Patient Re-evaluated prior to inductionOxygen Delivery Method: Circle system utilized Preoxygenation: Pre-oxygenation with 100% oxygen Intubation Type: IV induction Ventilation: Mask ventilation without difficulty LMA: LMA inserted LMA Size: 4.0 Number of attempts: 1 Placement Confirmation: positive ETCO2 and breath sounds checked- equal and bilateral Tube secured with: Tape Dental Injury: Teeth and Oropharynx as per pre-operative assessment

## 2012-07-29 ENCOUNTER — Encounter (HOSPITAL_COMMUNITY): Payer: Self-pay | Admitting: General Practice

## 2012-07-29 MED ORDER — TRAMADOL HCL 50 MG PO TABS: 50.0000 mg | ORAL_TABLET | Freq: Four times a day (QID) | ORAL | Status: DC | PRN

## 2012-07-29 NOTE — Discharge Summary (Signed)
Physician Discharge Summary  Patient ID: [name] MRN: [id number] DOB/AGE: Nov 27, 1940 71 y.o.  Admit date: 07/28/2012 Discharge date: 07/29/2012  Admission Diagnoses: Osteomyelitis right great toe  Discharge Diagnoses: Same Active Problems:  * No active hospital problems. *    Discharged Condition: stable  Hospital Course: Patient's hospital course was essentially unremarkable. She underwent amputation of the right great toe postoperatively she progressed well and was discharged to home in stable condition.  Consults: None  Significant Diagnostic Studies: labs: Routine labs  Treatments: surgery: See operative note  Discharge Exam: Blood pressure 120/52, pulse 59, temperature 97.8 F (36.6 C), temperature source Oral, resp. rate 16, SpO2 96.00%. Incision/Wound: incision clean and dry at time of discharge  Disposition: 01-Home or Self Care  Discharge Orders    Future Orders Please Complete By Expires   Diet - low sodium heart healthy      Call MD / Call 911      Comments:   If you experience chest pain or shortness of breath, CALL 911 and be transported to the hospital emergency room.  If you develope a fever above 101 F, pus (white drainage) or increased drainage or redness at the wound, or calf pain, call your surgeon's office.   Constipation Prevention      Comments:   Drink plenty of fluids.  Prune juice may be helpful.  You may use a stool softener, such as Colace (over the counter) 100 mg twice a day.  Use MiraLax (over the counter) for constipation as needed.   Increase activity slowly as tolerated          Medication List     As of 07/29/2012  7:36 AM    TAKE these medications         albuterol 108 (90 BASE) MCG/ACT inhaler   Commonly known as: PROVENTIL HFA;VENTOLIN HFA   Inhale 2 puffs into the lungs daily. Take every day per patient      ALPHA LIPOIC ACID PO   Take 600 mg by mouth daily.      aspirin 81 MG tablet   Take 81 mg by mouth daily.      AVAPRO 300 MG tablet   Generic drug: irbesartan   Take 300 mg by mouth Daily.      CALCIUM + D PO   Take 1 tablet by mouth 2 (two) times daily.      carvedilol 12.5 MG tablet   Commonly known as: COREG   Take 6.25 mg by mouth at bedtime.      CoQ-10 100 MG Caps   Take 1 tablet by mouth daily. Hold While in hospital      fish oil-omega-3 fatty acids 1000 MG capsule   Take 2 g by mouth 2 (two) times daily.      furosemide 20 MG tablet   Commonly known as: LASIX   Take 10 mg by mouth daily.      hydrochlorothiazide 25 MG tablet   Commonly known as: HYDRODIURIL   Take 25 mg by mouth Daily.      metFORMIN 500 MG tablet   Commonly known as: GLUCOPHAGE   Take 500 mg by mouth 2 (two) times daily with a meal.      omeprazole 20 MG capsule   Commonly known as: PRILOSEC   Take 20 mg by mouth daily.      potassium chloride SA 20 MEQ tablet   Commonly known as: K-DUR,KLOR-CON   Take 20 mEq by mouth 2 (  two) times daily.      rosuvastatin 5 MG tablet   Commonly known as: CRESTOR   Take 5 mg by mouth daily.      traMADol 50 MG tablet   Commonly known as: ULTRAM   Take 1 tablet (50 mg total) by mouth every 6 (six) hours as needed.      VITAMIN D PO   Take 2,000 Units by mouth daily.           Follow-up Information    Follow up with Aiyden Lauderback V, MD. In 2 weeks.   Contact information:   [address] [place] [address] [phone]          Signed: Nadara Mustard 07/29/2012, 7:36 AM

## 2012-07-29 NOTE — Progress Notes (Signed)
Physical Therapy Evaluation Patient Details Name: [name] MRN: [id number] DOB: 08/26/1941 Today's Date: 07/29/2012 Time: 4098-1191 PT Time Calculation (min): 27 min  PT Assessment / Plan / Recommendation Clinical Impression  Pt is a 71 y.o. F s/p R great toe ampuatation.  Pt was encouraged to use walker at home to help increase stability and balance.  Pt is ready for d/c when deemed medically safe. No further PT needs.  PT will sign off.  However educated and encouraged pt to use RW for longer distance especially community for stability and energy conservation.  Pt agreed.    PT Assessment  Patent does not need any further PT services    Follow Up Recommendations  No PT follow up    Does the patient have the potential to tolerate intense rehabilitation      Barriers to Discharge  NONE      Equipment Recommendations  None recommended by PT    Recommendations for Other Services Other (comment) (None)   Frequency      Precautions / Restrictions Precautions Precautions: Fall Restrictions Weight Bearing Restrictions: Yes RLE Weight Bearing: Weight bearing as tolerated   Pertinent Vitals/Pain Did not report or rate pain.      Mobility  Bed Mobility Bed Mobility: Supine to Sit;Sitting - Scoot to Edge of Bed Supine to Sit: 5: Supervision Sitting - Scoot to [place] of Bed: 5: Supervision Details for Bed Mobility Assistance: Supervision for safety Transfers Transfers: Sit to Stand;Stand to Sit Sit to Stand: 5: Supervision;With upper extremity assist;From bed Stand to Sit: 5: Supervision;With upper extremity assist;To chair/3-in-1 Details for Transfer Assistance: Supervision for safety Ambulation/Gait Ambulation/Gait Assistance: 4: Min guard Ambulation Distance (Feet): 230 Feet Assistive device: Rolling walker Ambulation/Gait Assistance Details: Supervision for safety.  Pt only used walker during second half of ambulation.  Gait Pattern: Step-through pattern Stairs:  Yes Stairs Assistance: 4: Min guard Stair Management Technique: Forwards;One rail Right Number of Stairs: 4  Wheelchair Mobility Wheelchair Mobility: No    Shoulder Instructions     Exercises     PT Diagnosis:    PT Problem List:   PT Treatment Interventions:     PT Goals    Visit Information  Last PT Received On: 07/29/12 Assistance Needed: +1    Subjective Data  Subjective: I have had to wear that shoe before. Patient Stated Goal: To go home.   Prior Functioning  Home Living Lives With: Spouse;Son Available Help at Discharge: Family Type of Home: House Home Access: Stairs to enter Secretary/administrator of Steps: 3 Entrance Stairs-Rails: None Home Layout: One level Bathroom Shower/Tub: Tub/shower unit;Door [employer] Toilet: Standard Bathroom Accessibility: Yes (one bathroom forwards and one sideways) How Accessible: Accessible via walker Home Adaptive Equipment: Bedside commode/3-in-1;Walker - standard Prior Function Level of Independence: Independent Able to Take Stairs?: Yes Driving: No Vocation: Retired Musician: No difficulties Dominant Hand: Right    Cognition  Overall Cognitive Status: Appears within functional limits for tasks assessed/performed Arousal/Alertness: Awake/alert Orientation Level: Appears intact for tasks assessed Behavior During Session: [facility] for tasks performed    Extremity/Trunk Assessment     Balance Balance Balance Assessed: No  End of Session PT - End of Session Equipment Utilized During Treatment: Gait belt;Other (comment) ([place]) Activity Tolerance: Patient tolerated treatment well Patient left: in chair;with call bell/phone within reach;with family/visitor present Nurse Communication: Mobility status  GP     DITOMMASO, AMY 07/29/2012, 9:31 AM   Jake Shark, PT DPT [phone]

## 2012-07-30 LAB — GLUCOSE, CAPILLARY: Glucose-Capillary: 155 mg/dL — ABNORMAL HIGH (ref 70–99)

## 2013-01-11 ENCOUNTER — Other Ambulatory Visit (HOSPITAL_COMMUNITY): Payer: Self-pay | Admitting: Orthopedic Surgery

## 2013-01-11 ENCOUNTER — Encounter (HOSPITAL_COMMUNITY): Payer: Self-pay | Admitting: Pharmacy Technician

## 2013-01-11 NOTE — Pre-Procedure Instructions (Signed)
[name]  01/11/2013   Your procedure is scheduled on:  Friday, April 11th   Report to [facility] at  11:45 AM.  Call this number if you have problems the morning of surgery: [phone]   Remember:   Do not eat food or drink liquids after midnight Thursday.   Take these medicines the morning of surgery with A SIP OF WATER: Coreg, Prilosec, Tramadol, Inhaler.   Do not wear jewelry.  Do not wear lotions, powders, or perfumes. You may NOT wear deodorant.  Do not shave 48 hours prior to surgery.    Do not bring valuables to the hospital.  Contacts, dentures or bridgework may not be worn into surgery.   Leave suitcase in the car. After surgery it may be brought to your room.  For patients admitted to the hospital, checkout time is 11:00 AM the day of discharge.   Patients discharged the day of surgery will not be allowed to drive home.   Name and phone number of your driver:    Special Instructions: Shower using CHG 2 nights before surgery and the night before surgery.  If you shower the day of surgery use CHG.  Use special wash - you have one bottle of CHG for all showers.  You should use approximately 1/3 of the bottle for each shower.   Please read over the following fact sheets that you were given: Pain Booklet, Coughing and Deep Breathing, MRSA Information and Surgical Site Infection Prevention

## 2013-01-12 ENCOUNTER — Encounter (HOSPITAL_COMMUNITY): Payer: Self-pay

## 2013-01-12 ENCOUNTER — Encounter (HOSPITAL_COMMUNITY)
Admission: RE | Admit: 2013-01-12 | Discharge: 2013-01-12 | Disposition: A | Discharge status: Home / Self Care | Payer: Medicare HMO | Source: Ambulatory Visit | Attending: Orthopedic Surgery | Admitting: Orthopedic Surgery

## 2013-01-12 HISTORY — DX: Pneumonia, unspecified organism: J18.9

## 2013-01-12 LAB — APTT: aPTT: 30 seconds (ref 24–37)

## 2013-01-12 LAB — CBC
MCHC: 32.7 g/dL (ref 30.0–36.0)
Platelets: 261 10*3/uL (ref 150–400)
RDW: 14.6 % (ref 11.5–15.5)
WBC: 6.7 10*3/uL (ref 4.0–10.5)

## 2013-01-12 LAB — COMPREHENSIVE METABOLIC PANEL
ALT: 19 U/L (ref 0–35)
AST: 15 U/L (ref 0–37)
Albumin: 3.8 g/dL (ref 3.5–5.2)
Alkaline Phosphatase: 68 U/L (ref 39–117)
Potassium: 3.7 mEq/L (ref 3.5–5.1)
Sodium: 139 mEq/L (ref 135–145)
Total Protein: 6.9 g/dL (ref 6.0–8.3)

## 2013-01-12 LAB — PROTIME-INR: Prothrombin Time: 13.5 seconds (ref 11.6–15.2)

## 2013-01-12 LAB — SURGICAL PCR SCREEN: Staphylococcus aureus: NEGATIVE

## 2013-01-12 NOTE — Progress Notes (Signed)
Patient informed Nurse that she had a stress test last year before surgery. Patient denied having a cardiac cath or sleep study. PCP is Dr. Rodrigo Ran. Patient denied having any cardiac or pulmonary problems.

## 2013-01-13 MED ORDER — CLINDAMYCIN PHOSPHATE 900 MG/50ML IV SOLN
900.0000 mg | INTRAVENOUS | Status: AC
  Administered 2013-01-14: 900 mg via INTRAVENOUS
  Filled 2013-01-13: qty 50

## 2013-01-14 ENCOUNTER — Ambulatory Visit (HOSPITAL_COMMUNITY): Payer: Medicare HMO | Admitting: Certified Registered"

## 2013-01-14 ENCOUNTER — Encounter (HOSPITAL_COMMUNITY)
Admission: RE | Disposition: A | Discharge status: Home / Self Care | Payer: Self-pay | Source: Ambulatory Visit | Attending: Orthopedic Surgery

## 2013-01-14 ENCOUNTER — Ambulatory Visit (HOSPITAL_COMMUNITY)
Admission: RE | Admit: 2013-01-14 | Discharge: 2013-01-14 | Disposition: A | Discharge status: Home / Self Care | Payer: Medicare HMO | Source: Ambulatory Visit | Attending: Orthopedic Surgery | Admitting: Orthopedic Surgery

## 2013-01-14 ENCOUNTER — Encounter (HOSPITAL_COMMUNITY): Payer: Self-pay | Admitting: *Deleted

## 2013-01-14 ENCOUNTER — Encounter (HOSPITAL_COMMUNITY): Payer: Self-pay | Admitting: Certified Registered"

## 2013-01-14 DIAGNOSIS — K219 Gastro-esophageal reflux disease without esophagitis: Secondary | ICD-10-CM | POA: Insufficient documentation

## 2013-01-14 DIAGNOSIS — M810 Age-related osteoporosis without current pathological fracture: Secondary | ICD-10-CM | POA: Insufficient documentation

## 2013-01-14 DIAGNOSIS — J45909 Unspecified asthma, uncomplicated: Secondary | ICD-10-CM | POA: Insufficient documentation

## 2013-01-14 DIAGNOSIS — I10 Essential (primary) hypertension: Secondary | ICD-10-CM | POA: Insufficient documentation

## 2013-01-14 DIAGNOSIS — E119 Type 2 diabetes mellitus without complications: Secondary | ICD-10-CM | POA: Insufficient documentation

## 2013-01-14 DIAGNOSIS — M86679 Other chronic osteomyelitis, unspecified ankle and foot: Secondary | ICD-10-CM | POA: Insufficient documentation

## 2013-01-14 DIAGNOSIS — M869 Osteomyelitis, unspecified: Secondary | ICD-10-CM

## 2013-01-14 HISTORY — PX: AMPUTATION: SHX166

## 2013-01-14 LAB — GLUCOSE, CAPILLARY
Glucose-Capillary: 93 mg/dL (ref 70–99)
Glucose-Capillary: 97 mg/dL (ref 70–99)

## 2013-01-14 SURGERY — AMPUTATION DIGIT
Anesthesia: General | Site: Foot | Laterality: Right | Wound class: Dirty or Infected

## 2013-01-14 MED ORDER — MIDAZOLAM HCL 5 MG/5ML IJ SOLN
INTRAMUSCULAR | Status: DC | PRN
  Administered 2013-01-14: 2 mg via INTRAVENOUS

## 2013-01-14 MED ORDER — EPHEDRINE SULFATE 50 MG/ML IJ SOLN
INTRAMUSCULAR | Status: DC | PRN
  Administered 2013-01-14: 15 mg via INTRAVENOUS

## 2013-01-14 MED ORDER — LIDOCAINE HCL (CARDIAC) 20 MG/ML IV SOLN
INTRAVENOUS | Status: DC | PRN
  Administered 2013-01-14: 60 mg via INTRAVENOUS

## 2013-01-14 MED ORDER — PROPOFOL 10 MG/ML IV BOLUS
INTRAVENOUS | Status: DC | PRN
  Administered 2013-01-14: 140 mg via INTRAVENOUS

## 2013-01-14 MED ORDER — LACTATED RINGERS IV SOLN
INTRAVENOUS | Status: DC
  Administered 2013-01-14: 13:00:00 via INTRAVENOUS

## 2013-01-14 MED ORDER — PROMETHAZINE HCL 25 MG/ML IJ SOLN: 6.2500 mg | INTRAMUSCULAR | Status: DC | PRN

## 2013-01-14 MED ORDER — 0.9 % SODIUM CHLORIDE (POUR BTL) OPTIME
TOPICAL | Status: DC | PRN
  Administered 2013-01-14: 1000 mL

## 2013-01-14 MED ORDER — FENTANYL CITRATE 0.05 MG/ML IJ SOLN: 25.0000 ug | INTRAMUSCULAR | Status: DC | PRN

## 2013-01-14 MED ORDER — ONDANSETRON HCL 4 MG/2ML IJ SOLN
INTRAMUSCULAR | Status: DC | PRN
  Administered 2013-01-14: 4 mg via INTRAVENOUS

## 2013-01-14 MED ORDER — FENTANYL CITRATE 0.05 MG/ML IJ SOLN
INTRAMUSCULAR | Status: DC | PRN
  Administered 2013-01-14: 50 ug via INTRAVENOUS

## 2013-01-14 MED ORDER — FENTANYL CITRATE 0.05 MG/ML IJ SOLN
INTRAMUSCULAR | Status: AC
  Filled 2013-01-14: qty 2

## 2013-01-14 MED ORDER — TRAMADOL HCL 50 MG PO TABS
100.0000 mg | ORAL_TABLET | ORAL | Status: AC
  Administered 2013-01-14: 50 mg via ORAL
  Filled 2013-01-14: qty 2

## 2013-01-14 SURGICAL SUPPLY — 51 items
BANDAGE GAUZE 4  KLING STR (GAUZE/BANDAGES/DRESSINGS) IMPLANT
BLADE AVERAGE 25X9 (BLADE) IMPLANT
BLADE MINI RND TIP GREEN BEAV (BLADE) IMPLANT
BNDG CMPR 9X4 STRL LF SNTH (GAUZE/BANDAGES/DRESSINGS)
BNDG COHESIVE 1X5 TAN STRL LF (GAUZE/BANDAGES/DRESSINGS) IMPLANT
BNDG COHESIVE 4X5 TAN STRL (GAUZE/BANDAGES/DRESSINGS) ×1 IMPLANT
BNDG COHESIVE 6X5 TAN STRL LF (GAUZE/BANDAGES/DRESSINGS) IMPLANT
BNDG ESMARK 4X9 LF (GAUZE/BANDAGES/DRESSINGS) ×1 IMPLANT
BNDG GAUZE STRTCH 6 (GAUZE/BANDAGES/DRESSINGS) IMPLANT
CLOTH BEACON ORANGE TIMEOUT ST (SAFETY) ×2 IMPLANT
CORDS BIPOLAR (ELECTRODE) ×1 IMPLANT
COVER SURGICAL LIGHT HANDLE (MISCELLANEOUS) ×2 IMPLANT
CUFF TOURNIQUET SINGLE 18IN (TOURNIQUET CUFF) IMPLANT
CUFF TOURNIQUET SINGLE 24IN (TOURNIQUET CUFF) IMPLANT
CUFF TOURNIQUET SINGLE 34IN LL (TOURNIQUET CUFF) IMPLANT
CUFF TOURNIQUET SINGLE 44IN (TOURNIQUET CUFF) IMPLANT
DRAPE U-SHAPE 47X51 STRL (DRAPES) ×2 IMPLANT
DRSG ADAPTIC 3X8 NADH LF (GAUZE/BANDAGES/DRESSINGS) ×1 IMPLANT
DURAPREP 26ML APPLICATOR (WOUND CARE) ×2 IMPLANT
ELECT REM PT RETURN 9FT ADLT (ELECTROSURGICAL) ×2
ELECTRODE REM PT RTRN 9FT ADLT (ELECTROSURGICAL) ×1 IMPLANT
GAUZE SPONGE 2X2 8PLY STRL LF (GAUZE/BANDAGES/DRESSINGS) IMPLANT
GLOVE BIOGEL PI IND STRL 9 (GLOVE) ×1 IMPLANT
GLOVE BIOGEL PI INDICATOR 9 (GLOVE) ×1
GLOVE SURG ORTHO 9.0 STRL STRW (GLOVE) ×2 IMPLANT
GOWN PREVENTION PLUS XLARGE (GOWN DISPOSABLE) ×2 IMPLANT
GOWN SRG XL XLNG 56XLVL 4 (GOWN DISPOSABLE) ×1 IMPLANT
GOWN STRL NON-REIN XL XLG LVL4 (GOWN DISPOSABLE) ×2
KIT BASIN OR (CUSTOM PROCEDURE TRAY) ×2 IMPLANT
KIT ROOM TURNOVER OR (KITS) ×2 IMPLANT
MANIFOLD NEPTUNE II (INSTRUMENTS) ×1 IMPLANT
NDL HYPO 25GX1X1/2 BEV (NEEDLE) IMPLANT
NEEDLE HYPO 25GX1X1/2 BEV (NEEDLE) IMPLANT
NS IRRIG 1000ML POUR BTL (IV SOLUTION) ×2 IMPLANT
PACK ORTHO EXTREMITY (CUSTOM PROCEDURE TRAY) ×2 IMPLANT
PAD ARMBOARD 7.5X6 YLW CONV (MISCELLANEOUS) ×4 IMPLANT
PAD CAST 4YDX4 CTTN HI CHSV (CAST SUPPLIES) IMPLANT
PADDING CAST COTTON 4X4 STRL (CAST SUPPLIES)
PADDING CAST SYNTHETIC 4 (CAST SUPPLIES) ×1
PADDING CAST SYNTHETIC 4X4 STR (CAST SUPPLIES) IMPLANT
SPECIMEN JAR SMALL (MISCELLANEOUS) ×1 IMPLANT
SPONGE GAUZE 2X2 STER 10/PKG (GAUZE/BANDAGES/DRESSINGS)
SPONGE GAUZE 4X4 12PLY (GAUZE/BANDAGES/DRESSINGS) ×1 IMPLANT
SUCTION FRAZIER TIP 10 FR DISP (SUCTIONS) IMPLANT
SUT ETHILON 2 0 PSLX (SUTURE) ×1 IMPLANT
SUT VIC AB 2-0 FS1 27 (SUTURE) IMPLANT
SYR CONTROL 10ML LL (SYRINGE) IMPLANT
TOWEL OR 17X24 6PK STRL BLUE (TOWEL DISPOSABLE) ×2 IMPLANT
TOWEL OR 17X26 10 PK STRL BLUE (TOWEL DISPOSABLE) ×2 IMPLANT
TUBE CONNECTING 12X1/4 (SUCTIONS) IMPLANT
WATER STERILE IRR 1000ML POUR (IV SOLUTION) ×1 IMPLANT

## 2013-01-14 NOTE — Preoperative (Signed)
Beta Blockers   Reason not to administer Beta Blockers:Not Applicable 

## 2013-01-14 NOTE — Anesthesia Postprocedure Evaluation (Signed)
  Anesthesia Post-op Note  Patient: [name]  Procedure(s) Performed: Procedure(s) with comments: AMPUTATION DIGIT (Right) - Amputation Right Foot 2 & 3rd Toe MTP Joint  Patient Location: PACU  Anesthesia Type:General  Level of Consciousness: awake  Airway and Oxygen Therapy: Patient Spontanous Breathing  Post-op Pain: mild  Post-op Assessment: Post-op Vital signs reviewed  Post-op Vital Signs: stable  Complications: No apparent anesthesia complications

## 2013-01-14 NOTE — Anesthesia Preprocedure Evaluation (Signed)
Anesthesia Evaluation  Patient identified by MRN, date of birth, ID band Patient awake    Reviewed: Allergy & Precautions, H&P , NPO status , Patient's Chart, lab work & pertinent test results, reviewed documented beta blocker date and time   Airway Mallampati: II TM Distance: >3 FB Neck ROM: Full    Dental no notable dental hx. (+) Teeth Intact, Caps and Dental Advidsory Given   Pulmonary shortness of breath and with exertion, asthma ,  breath sounds clear to auscultation  Pulmonary exam normal       Cardiovascular hypertension, On Home Beta Blockers Rhythm:Regular Rate:Normal     Neuro/Psych  Neuromuscular disease    GI/Hepatic hiatal hernia, GERD-  Medicated and Controlled,  Endo/Other  diabetes, Well Controlled, Type 2  Renal/GU      Musculoskeletal   Abdominal   Peds  Hematology   Anesthesia Other Findings   Reproductive/Obstetrics                           Anesthesia Physical Anesthesia Plan  ASA: III  Anesthesia Plan: General LMA   Post-op Pain Management:    Induction:   Airway Management Planned:   Additional Equipment:   Intra-op Plan:   Post-operative Plan:   Informed Consent: I have reviewed the patients History and Physical, chart, labs and discussed the procedure including the risks, benefits and alternatives for the proposed anesthesia with the patient or authorized representative who has indicated his/her understanding and acceptance.     Plan Discussed with: CRNA and Surgeon  Anesthesia Plan Comments:         Anesthesia Quick Evaluation

## 2013-01-14 NOTE — Op Note (Signed)
OPERATIVE REPORT  DATE OF SURGERY: 01/14/2013  PATIENT:  [name],  72 y.o. female  PRE-OPERATIVE DIAGNOSIS:  Osteomyelitis Right Foot 2 & 3rd toes  POST-OPERATIVE DIAGNOSIS:  Osteomyelitis right foot second and third toes  PROCEDURE:  Procedure(s): AMPUTATION DIGIT  SURGEON:  Surgeon(s): Nadara Mustard, MD  ANESTHESIA:   general  EBL:  Minimal ML  SPECIMEN:  No Specimen  TOURNIQUET:  * No tourniquets in log *  PROCEDURE DETAILS: Patient is a 72 year old woman who presents with chronic ulceration and osteomyelitis of the right foot second and third toes she is still conservative treatment and presents at this time for amputation. Risks and benefits were discussed including persistent infection nonhealing of the wound need for additional surgery. Patient states she understands and wishes to proceed at this time. Description of procedure patient brought to the operating room and underwent a general anesthetic after adequate levels of anesthesia were obtained patient's right lower extremity was prepped using DuraPrep and draped into a sterile field. An incision was made just distal to the MTP joint the second and third toes. The second and third toes were amputated through the MTP joint. The wounds irrigated normal saline hemostasis was obtained the incision was closed using 2-0 nylon. The wound was covered with Adaptic orthopedic sponges level and Coban. Patient was extubated taken to the PACU in stable condition.  PLAN OF CARE: Discharge to home after PACU  PATIENT DISPOSITION:  PACU - hemodynamically stable.   Nadara Mustard, MD 01/14/2013 2:59 PM

## 2013-01-14 NOTE — H&P (Signed)
[name] is an 72 y.o. female.   Chief Complaint: Osteomyelitis ulceration right foot second and third toes HPI: Patient is a 72 year old woman with diabetes who has had chronic ulceration of the right second and third toes and has failed conservative wound care.  Past Medical History  Diagnosis Date  . Mild aortic stenosis     a. 06/2011 Echo EF 55-60%, Mild AI/AS,   . Morbid obesity   . HTN (hypertension)   . Osteoporosis   . Diaphragm dysfunction   . DOE (dyspnea on exertion)   . Hiatal hernia   . GERD (gastroesophageal reflux disease)   . Osteoarthritis   . Asthma   . Palpitations   . Hyperlipidemia   . 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 Multiple thyroid nodules     have been drained x 1  . Diverticulosis   . Chronic kidney disease   . Diabetes mellitus     dx 10 yrs ago  . Pneumonia     hx of    Past Surgical History  Procedure Laterality Date  . Total knee arthroplasty      BILATERAL  . Appendectomy    . Bilateral oophorectomy    . Finger surgery      LEFT MIDDLE FINGER  . Mass excision      BACK  . Dilation and curettage of uterus      SEVERAL  . Biopsy thyroid    . Bilateral knee replacements    . Left shoulder replacement    . Right shoulder repair    . Left fot 3rd toe surgery    . Amputation left 3rd toe    . Joint replacement      bilateral total knees  . Bilateral cataracts    . Toe amputation  07/28/2012    RIGHT GREAT TOE  . Amputation  07/28/2012    Procedure: AMPUTATION DIGIT;  Surgeon: Nadara Mustard, MD;  Location: [facility] OR;  Service: Orthopedics;  Laterality: Right;  Right Great Toe Amputation  . Eye surgery    . Toe amputation Right     Big toe    Family History  Problem Relation Age of Onset  . Alzheimer's disease Father   . Heart failure Father   . Coronary artery disease Father   . Diabetes Father   . Coronary artery disease Mother   . Hypertension Mother   . Stroke Mother   . Diabetes  Mother   . Coronary artery disease Brother   . Diabetes Brother   . Hypertension Brother   . Heart attack Sister   . Diabetes Sister   . Hypertension Sister   . Stroke Daughter   . Deep vein thrombosis Son    Social History:  reports that she has never smoked. She has never used smokeless tobacco. She reports that she does not drink alcohol or use illicit drugs.  Allergies:  Allergies  Allergen Reactions  . Ceclor (Cefaclor) Anaphylaxis, Hives and Nausea And Vomiting  . Latex Swelling  . Penicillins Anaphylaxis and Rash  . Dilaudid (Hydromorphone Hcl) Hives  . Hydrocodone Nausea And Vomiting and Other (See Comments)    DISORIENTED  . Lyrica (Pregabalin) Swelling  . Neurontin (Gabapentin) Swelling  . Oxycodone Nausea And Vomiting and Other (See Comments)    DISORIENTED  . Robaxin (Methocarbamol) Nausea And Vomiting  . Streptomycin Hives and Nausea And Vomiting  . Terramycin Nausea And Vomiting  .  Terramycin (Oxytetracycline)     Nausea (Plus E Myocin)  . Vicodin (Hydrocodone-Acetaminophen) Hives  . Sulfa Drugs Cross Reactors Rash  . Zocor (Simvastatin - High Dose) Rash    No prescriptions prior to admission    Results for orders placed during the hospital encounter of 01/12/13 (from the past 48 hour(s))  SURGICAL PCR SCREEN     Status: None   Collection Time    01/12/13  1:55 PM      Result Value Range   MRSA, PCR NEGATIVE  NEGATIVE   Staphylococcus aureus NEGATIVE  NEGATIVE   Comment:            The Xpert SA Assay (FDA     approved for NASAL specimens     in patients over 12 years of age),     is one component of     a comprehensive surveillance     program.  Test performance has     been validated by [employer] for patients greater     than or equal to 20 year old.     It is not intended     to diagnose infection nor to     guide or monitor treatment.  APTT     Status: None   Collection Time    01/12/13  1:55 PM      Result Value Range   aPTT 30  24  - 37 seconds  CBC     Status: None   Collection Time    01/12/13  1:55 PM      Result Value Range   WBC 6.7  4.0 - 10.5 K/uL   RBC 4.65  3.87 - 5.11 MIL/uL   Hemoglobin 12.2  12.0 - 15.0 g/dL   HCT 40.9  81.1 - 91.4 %   MCV 80.2  78.0 - 100.0 fL   MCH 26.2  26.0 - 34.0 pg   MCHC 32.7  30.0 - 36.0 g/dL   RDW 78.2  95.6 - 21.3 %   Platelets 261  150 - 400 K/uL  COMPREHENSIVE METABOLIC PANEL     Status: Abnormal   Collection Time    01/12/13  1:55 PM      Result Value Range   Sodium 139  135 - 145 mEq/L   Potassium 3.7  3.5 - 5.1 mEq/L   Chloride 99  96 - 112 mEq/L   CO2 31  19 - 32 mEq/L   Glucose, Bld 117 (*) 70 - 99 mg/dL   BUN 20  6 - 23 mg/dL   Creatinine, Ser 0.86  0.50 - 1.10 mg/dL   Calcium 9.8  8.4 - 57.8 mg/dL   Total Protein 6.9  6.0 - 8.3 g/dL   Albumin 3.8  3.5 - 5.2 g/dL   AST 15  0 - 37 U/L   ALT 19  0 - 35 U/L   Alkaline Phosphatase 68  39 - 117 U/L   Total Bilirubin 0.3  0.3 - 1.2 mg/dL   GFR calc non Af Amer 85 (*) >90 mL/min   GFR calc Af Amer >90  >90 mL/min   Comment:            The eGFR has been calculated     using the CKD EPI equation.     This calculation has not been     validated in all clinical     situations.     eGFR's persistently     <90  mL/min signify     possible Chronic Kidney Disease.  PROTIME-INR     Status: None   Collection Time    01/12/13  1:55 PM      Result Value Range   Prothrombin Time 13.5  11.6 - 15.2 seconds   INR 1.04  0.00 - 1.49   No results found.  Review of Systems  All other systems reviewed and are negative.    There were no vitals taken for this visit. Physical Exam  On examination patient has palpable pulses. There is ulceration osteomyelitis of the right second and third toe. Assessment/Plan Assessment: [place] mellitus ulceration right foot second and third toes.  Plan: We will plan for amputation of the right second and third toes at the MTP joint. Risks and benefits were discussed including  infection neurovascular injury nonhealing of the wound need for additional surgery. Patient states she understands and wished to proceed at this time.  [name] 01/14/2013, 6:53 AM

## 2013-01-14 NOTE — Transfer of Care (Signed)
Immediate Anesthesia Transfer of Care Note  Patient: [name]  Procedure(s) Performed: Procedure(s) with comments: AMPUTATION DIGIT (Right) - Amputation Right Foot 2 & 3rd Toe MTP Joint  Patient Location: PACU  Anesthesia Type:General  Level of Consciousness: awake  Airway & Oxygen Therapy: Patient Spontanous Breathing and Patient connected to nasal cannula oxygen  Post-op Assessment: Report given to PACU RN, Post -op Vital signs reviewed and stable and Patient moving all extremities  Post vital signs: Reviewed and stable  Complications: No apparent anesthesia complications

## 2013-01-18 ENCOUNTER — Encounter (HOSPITAL_COMMUNITY): Payer: Self-pay | Admitting: Orthopedic Surgery

## 2013-02-03 ENCOUNTER — Encounter: Payer: Self-pay | Admitting: *Deleted

## 2013-02-03 ENCOUNTER — Encounter: Payer: Self-pay | Admitting: Cardiovascular Disease

## 2013-08-02 ENCOUNTER — Other Ambulatory Visit (HOSPITAL_COMMUNITY): Payer: Self-pay | Admitting: Orthopedic Surgery

## 2013-08-02 ENCOUNTER — Encounter (HOSPITAL_COMMUNITY): Payer: Self-pay | Admitting: *Deleted

## 2013-08-02 ENCOUNTER — Other Ambulatory Visit (HOSPITAL_COMMUNITY): Payer: Self-pay | Admitting: *Deleted

## 2013-08-02 ENCOUNTER — Encounter (HOSPITAL_COMMUNITY): Payer: Self-pay | Admitting: Pharmacist

## 2013-08-03 ENCOUNTER — Encounter (HOSPITAL_COMMUNITY): Payer: Self-pay | Admitting: *Deleted

## 2013-08-03 ENCOUNTER — Ambulatory Visit (HOSPITAL_COMMUNITY): Payer: Medicare HMO | Admitting: Certified Registered"

## 2013-08-03 ENCOUNTER — Ambulatory Visit (HOSPITAL_COMMUNITY): Payer: Medicare HMO

## 2013-08-03 ENCOUNTER — Encounter (HOSPITAL_COMMUNITY): Payer: Medicare HMO | Admitting: Certified Registered"

## 2013-08-03 ENCOUNTER — Observation Stay (HOSPITAL_COMMUNITY)
Admission: RE | Admit: 2013-08-03 | Discharge: 2013-08-04 | Disposition: A | Discharge status: Home Health | Payer: Medicare HMO | Source: Ambulatory Visit | Attending: Orthopedic Surgery | Admitting: Orthopedic Surgery

## 2013-08-03 ENCOUNTER — Encounter (HOSPITAL_COMMUNITY)
Admission: RE | Disposition: A | Discharge status: Home Health | Payer: Self-pay | Source: Ambulatory Visit | Attending: Orthopedic Surgery

## 2013-08-03 DIAGNOSIS — Z79899 Other long term (current) drug therapy: Secondary | ICD-10-CM | POA: Insufficient documentation

## 2013-08-03 DIAGNOSIS — I129 Hypertensive chronic kidney disease with stage 1 through stage 4 chronic kidney disease, or unspecified chronic kidney disease: Secondary | ICD-10-CM | POA: Insufficient documentation

## 2013-08-03 DIAGNOSIS — L03039 Cellulitis of unspecified toe: Secondary | ICD-10-CM | POA: Insufficient documentation

## 2013-08-03 DIAGNOSIS — L02619 Cutaneous abscess of unspecified foot: Secondary | ICD-10-CM | POA: Insufficient documentation

## 2013-08-03 DIAGNOSIS — Z794 Long term (current) use of insulin: Secondary | ICD-10-CM | POA: Insufficient documentation

## 2013-08-03 DIAGNOSIS — E1169 Type 2 diabetes mellitus with other specified complication: Principal | ICD-10-CM | POA: Insufficient documentation

## 2013-08-03 DIAGNOSIS — Z23 Encounter for immunization: Secondary | ICD-10-CM | POA: Insufficient documentation

## 2013-08-03 DIAGNOSIS — E1149 Type 2 diabetes mellitus with other diabetic neurological complication: Secondary | ICD-10-CM

## 2013-08-03 DIAGNOSIS — E1142 Type 2 diabetes mellitus with diabetic polyneuropathy: Secondary | ICD-10-CM | POA: Insufficient documentation

## 2013-08-03 DIAGNOSIS — M9080 Osteopathy in diseases classified elsewhere, unspecified site: Secondary | ICD-10-CM | POA: Insufficient documentation

## 2013-08-03 DIAGNOSIS — N189 Chronic kidney disease, unspecified: Secondary | ICD-10-CM | POA: Insufficient documentation

## 2013-08-03 DIAGNOSIS — L97509 Non-pressure chronic ulcer of other part of unspecified foot with unspecified severity: Secondary | ICD-10-CM | POA: Insufficient documentation

## 2013-08-03 DIAGNOSIS — M869 Osteomyelitis, unspecified: Secondary | ICD-10-CM

## 2013-08-03 HISTORY — PX: AMPUTATION: SHX166

## 2013-08-03 HISTORY — PX: TOE AMPUTATION: SHX809

## 2013-08-03 HISTORY — DX: Polyp of colon: K63.5

## 2013-08-03 HISTORY — DX: Cardiac arrhythmia, unspecified: I49.9

## 2013-08-03 LAB — COMPREHENSIVE METABOLIC PANEL
ALT: 17 U/L (ref 0–35)
AST: 14 U/L (ref 0–37)
BUN: 24 mg/dL — ABNORMAL HIGH (ref 6–23)
CO2: 26 mEq/L (ref 19–32)
Calcium: 9.1 mg/dL (ref 8.4–10.5)
Creatinine, Ser: 0.72 mg/dL (ref 0.50–1.10)
GFR calc Af Amer: >90 mL/min (ref >90)
GFR calc non Af Amer: 84 mL/min — ABNORMAL LOW (ref >90)
Sodium: 138 mEq/L (ref 135–145)
Total Protein: 6.2 g/dL (ref 6.0–8.3)

## 2013-08-03 LAB — CBC
MCH: 26.9 pg (ref 26.0–34.0)
MCHC: 32.9 g/dL (ref 30.0–36.0)
MCV: 81.6 fL (ref 78.0–100.0)
Platelets: 200 10*3/uL (ref 150–400)
RBC: 3.91 MIL/uL (ref 3.87–5.11)

## 2013-08-03 LAB — PROTIME-INR
INR: 1.07 (ref 0.00–1.49)
Prothrombin Time: 13.7 seconds (ref 11.6–15.2)

## 2013-08-03 LAB — GLUCOSE, CAPILLARY
Glucose-Capillary: 93 mg/dL (ref 70–99)
Glucose-Capillary: 92 mg/dL (ref 70–99)
Glucose-Capillary: 110 mg/dL — ABNORMAL HIGH (ref 70–99)

## 2013-08-03 SURGERY — AMPUTATION DIGIT
Anesthesia: General | Site: Foot | Laterality: Left | Wound class: Dirty or Infected

## 2013-08-03 MED ORDER — ONDANSETRON HCL 4 MG/2ML IJ SOLN: 4.0000 mg | Freq: Once | INTRAMUSCULAR | Status: DC | PRN

## 2013-08-03 MED ORDER — GLYCOPYRROLATE 0.2 MG/ML IJ SOLN
INTRAMUSCULAR | Status: DC | PRN
  Administered 2013-08-03: 0.2 mg via INTRAVENOUS

## 2013-08-03 MED ORDER — BUPIVACAINE HCL (PF) 0.5 % IJ SOLN
INTRAMUSCULAR | Status: AC
  Filled 2013-08-03: qty 10

## 2013-08-03 MED ORDER — INFLUENZA VAC SPLIT QUAD 0.5 ML IM SUSP
0.5000 mL | INTRAMUSCULAR | Status: AC
  Administered 2013-08-04: 0.5 mL via INTRAMUSCULAR
  Filled 2013-08-03: qty 0.5

## 2013-08-03 MED ORDER — FENTANYL CITRATE 0.05 MG/ML IJ SOLN: 25.0000 ug | INTRAMUSCULAR | Status: DC | PRN

## 2013-08-03 MED ORDER — MEPERIDINE HCL 25 MG/ML IJ SOLN: 6.2500 mg | INTRAMUSCULAR | Status: DC | PRN

## 2013-08-03 MED ORDER — ONDANSETRON HCL 4 MG PO TABS: 4.0000 mg | ORAL_TABLET | Freq: Four times a day (QID) | ORAL | Status: DC | PRN

## 2013-08-03 MED ORDER — 0.9 % SODIUM CHLORIDE (POUR BTL) OPTIME
TOPICAL | Status: DC | PRN
  Administered 2013-08-03: 1000 mL

## 2013-08-03 MED ORDER — MORPHINE SULFATE 2 MG/ML IJ SOLN
1.0000 mg | INTRAMUSCULAR | Status: DC | PRN
  Administered 2013-08-03: 1 mg via INTRAVENOUS
  Filled 2013-08-03: qty 1

## 2013-08-03 MED ORDER — CLINDAMYCIN PHOSPHATE 900 MG/50ML IV SOLN
900.0000 mg | INTRAVENOUS | Status: AC
  Administered 2013-08-03: 900 mg via INTRAVENOUS
  Filled 2013-08-03: qty 50

## 2013-08-03 MED ORDER — PROPOFOL 10 MG/ML IV BOLUS
INTRAVENOUS | Status: DC | PRN
  Administered 2013-08-03: 150 mg via INTRAVENOUS

## 2013-08-03 MED ORDER — DIPHENHYDRAMINE HCL 25 MG PO CAPS
50.0000 mg | ORAL_CAPSULE | Freq: Four times a day (QID) | ORAL | Status: DC | PRN
  Administered 2013-08-03: 50 mg via ORAL
  Filled 2013-08-03: qty 2

## 2013-08-03 MED ORDER — METOCLOPRAMIDE HCL 10 MG PO TABS: 5.0000 mg | ORAL_TABLET | Freq: Three times a day (TID) | ORAL | Status: DC | PRN

## 2013-08-03 MED ORDER — FENTANYL CITRATE 0.05 MG/ML IJ SOLN
INTRAMUSCULAR | Status: DC | PRN
  Administered 2013-08-03: 100 ug via INTRAVENOUS

## 2013-08-03 MED ORDER — METOCLOPRAMIDE HCL 5 MG/ML IJ SOLN: 5.0000 mg | Freq: Three times a day (TID) | INTRAMUSCULAR | Status: DC | PRN

## 2013-08-03 MED ORDER — CLINDAMYCIN PHOSPHATE 600 MG/50ML IV SOLN
600.0000 mg | Freq: Four times a day (QID) | INTRAVENOUS | Status: AC
  Administered 2013-08-03 – 2013-08-04 (×3): 600 mg via INTRAVENOUS
  Filled 2013-08-03 (×3): qty 50

## 2013-08-03 MED ORDER — LIDOCAINE HCL (CARDIAC) 20 MG/ML IV SOLN
INTRAVENOUS | Status: DC | PRN
  Administered 2013-08-03: 100 mg via INTRAVENOUS

## 2013-08-03 MED ORDER — TRAMADOL HCL 50 MG PO TABS
50.0000 mg | ORAL_TABLET | Freq: Four times a day (QID) | ORAL | Status: DC | PRN
  Administered 2013-08-03 – 2013-08-04 (×2): 50 mg via ORAL
  Filled 2013-08-03 (×2): qty 1

## 2013-08-03 MED ORDER — TRAMADOL HCL 50 MG PO TABS: 50.0000 mg | ORAL_TABLET | Freq: Four times a day (QID) | ORAL | Status: DC | PRN

## 2013-08-03 MED ORDER — ONDANSETRON HCL 4 MG/2ML IJ SOLN: 4.0000 mg | Freq: Four times a day (QID) | INTRAMUSCULAR | Status: DC | PRN

## 2013-08-03 MED ORDER — LACTATED RINGERS IV SOLN
INTRAVENOUS | Status: DC
  Administered 2013-08-03: 09:00:00 via INTRAVENOUS

## 2013-08-03 MED ORDER — ONDANSETRON HCL 4 MG/2ML IJ SOLN
INTRAMUSCULAR | Status: DC | PRN
  Administered 2013-08-03: 4 mg via INTRAVENOUS

## 2013-08-03 MED ORDER — SODIUM CHLORIDE 0.9 % IV SOLN
INTRAVENOUS | Status: DC
  Administered 2013-08-03: 20 mL/h via INTRAVENOUS

## 2013-08-03 MED ORDER — BUPIVACAINE HCL (PF) 0.5 % IJ SOLN
INTRAMUSCULAR | Status: DC | PRN
  Administered 2013-08-03: 20 mL

## 2013-08-03 MED ORDER — INSULIN ASPART 100 UNIT/ML ~~LOC~~ SOLN
0.0000 [IU] | Freq: Three times a day (TID) | SUBCUTANEOUS | Status: DC
  Administered 2013-08-04: 2 [IU] via SUBCUTANEOUS

## 2013-08-03 SURGICAL SUPPLY — 32 items
BANDAGE GAUZE ELAST BULKY 4 IN (GAUZE/BANDAGES/DRESSINGS) ×1 IMPLANT
BNDG CMPR 9X4 STRL LF SNTH (GAUZE/BANDAGES/DRESSINGS)
BNDG COHESIVE 6X5 TAN STRL LF (GAUZE/BANDAGES/DRESSINGS) ×1 IMPLANT
BNDG ESMARK 4X9 LF (GAUZE/BANDAGES/DRESSINGS) IMPLANT
CLOTH BEACON ORANGE TIMEOUT ST (SAFETY) ×2 IMPLANT
COVER SURGICAL LIGHT HANDLE (MISCELLANEOUS) ×2 IMPLANT
DRAPE U-SHAPE 47X51 STRL (DRAPES) ×2 IMPLANT
DRSG ADAPTIC 3X8 NADH LF (GAUZE/BANDAGES/DRESSINGS) ×1 IMPLANT
DRSG PAD ABDOMINAL 8X10 ST (GAUZE/BANDAGES/DRESSINGS) ×1 IMPLANT
DURAPREP 26ML APPLICATOR (WOUND CARE) ×2 IMPLANT
ELECT REM PT RETURN 9FT ADLT (ELECTROSURGICAL) ×2
ELECTRODE REM PT RTRN 9FT ADLT (ELECTROSURGICAL) ×1 IMPLANT
GLOVE BIOGEL PI IND STRL 9 (GLOVE) ×1 IMPLANT
GLOVE BIOGEL PI INDICATOR 9 (GLOVE) ×1
GLOVE SURG ORTHO 9.0 STRL STRW (GLOVE) ×2 IMPLANT
GOWN PREVENTION PLUS XLARGE (GOWN DISPOSABLE) ×2 IMPLANT
GOWN SRG XL XLNG 56XLVL 4 (GOWN DISPOSABLE) ×1 IMPLANT
GOWN STRL NON-REIN XL XLG LVL4 (GOWN DISPOSABLE) ×2
KIT BASIN OR (CUSTOM PROCEDURE TRAY) ×2 IMPLANT
KIT ROOM TURNOVER OR (KITS) ×2 IMPLANT
MANIFOLD NEPTUNE II (INSTRUMENTS) ×2 IMPLANT
NEEDLE 22X1 1/2 (OR ONLY) (NEEDLE) IMPLANT
NS IRRIG 1000ML POUR BTL (IV SOLUTION) ×2 IMPLANT
PACK ORTHO EXTREMITY (CUSTOM PROCEDURE TRAY) ×2 IMPLANT
PAD ARMBOARD 7.5X6 YLW CONV (MISCELLANEOUS) ×4 IMPLANT
SPONGE GAUZE 4X4 12PLY (GAUZE/BANDAGES/DRESSINGS) ×1 IMPLANT
SUCTION FRAZIER TIP 10 FR DISP (SUCTIONS) IMPLANT
SUT ETHILON 2 0 PSLX (SUTURE) ×2 IMPLANT
SYR CONTROL 10ML LL (SYRINGE) IMPLANT
TOWEL OR 17X24 6PK STRL BLUE (TOWEL DISPOSABLE) ×2 IMPLANT
TOWEL OR 17X26 10 PK STRL BLUE (TOWEL DISPOSABLE) ×2 IMPLANT
TUBE CONNECTING 12X1/4 (SUCTIONS) IMPLANT

## 2013-08-03 NOTE — Anesthesia Procedure Notes (Signed)
Procedure Name: LMA Insertion Date/Time: 08/03/2013 9:27 AM Performed by: Arlice Colt B Pre-anesthesia Checklist: Emergency Drugs available, Patient identified, Suction available, Patient being monitored and Timeout performed Patient Re-evaluated:Patient Re-evaluated prior to inductionOxygen Delivery Method: Circle system utilized Preoxygenation: Pre-oxygenation with 100% oxygen Intubation Type: IV induction LMA: LMA inserted LMA Size: 4.0 Number of attempts: 1 Placement Confirmation: positive ETCO2 and breath sounds checked- equal and bilateral Tube secured with: Tape Dental Injury: Teeth and Oropharynx as per pre-operative assessment

## 2013-08-03 NOTE — Anesthesia Postprocedure Evaluation (Signed)
Anesthesia Post Note  Patient: [name]  Procedure(s) Performed: Procedure(s) (LRB): AMPUTATION DIGIT (Left)  Anesthesia type: general  Patient location: PACU  Post pain: Pain level controlled  Post assessment: Patient's Cardiovascular Status Stable  Last Vitals:  Filed Vitals:   08/03/13 1111  BP: 140/69  Pulse: 61  Temp: 36.5 C  Resp: 18    Post vital signs: Reviewed and stable  Level of consciousness: sedated  Complications: No apparent anesthesia complications

## 2013-08-03 NOTE — Op Note (Signed)
OPERATIVE REPORT  DATE OF SURGERY: 08/03/2013  PATIENT:  [name],  72 y.o. female  PRE-OPERATIVE DIAGNOSIS:  Osteomyelitis and Ulceration Left Foot Great Toe and 2nd Toe  POST-OPERATIVE DIAGNOSIS:  Osteomyelitis and Ulceration Left Foot Great Toe and 2nd Toe  PROCEDURE:  Procedure(s): AMPUTATION DIGIT  SURGEON:  Surgeon(s): Nadara Mustard, MD  ANESTHESIA:   general  EBL:  min ML  SPECIMEN:  Source of Specimen:  left foot great toe and second toe  TOURNIQUET:  * No tourniquets in log *  PROCEDURE DETAILS: Patient is a 72 year old woman with diabetic insensate neuropathy with chronic ulceration and infection of the great toe and second toe and due to failure conservative care patient presents at this time for surgical intervention. Risks and benefits were discussed including infection neurovascular injury nonhealing of the wound need for additional surgery. Patient states she understands and wished to proceed at this time. Description of procedure patient was brought to the operating room and underwent a general anesthetic. After adequate levels of anesthesia were obtained patient's left lower extremity was prepped using DuraPrep and draped into a sterile field. A fishmouth incision was made just distal to the MTP joint of the great toe and second toe. These 2 toes were amputated in one block of tissue. There is no deep abscess there was decreased circulation with calcification of the smaller vessels there was a petechial bleeding. There is no abscess the wound is irrigated with normal saline the incision was closed using 2-0 nylon the wound was covered with Adaptic orthopedic sponges AB dressing Kerlix and Coban. Patient was extubated taken to the PACU in stable condition she underwent a local infiltration with 20 cc of percent Marcaine plain.  PLAN OF CARE: Admit for overnight observation  PATIENT DISPOSITION:  PACU - hemodynamically stable.   Nadara Mustard, MD 08/03/2013 10:17  AM

## 2013-08-03 NOTE — H&P (Signed)
[name] is an 72 y.o. female.   Chief Complaint: Osteomyelitis left foot great toe and second toe HPI: Patient is a 72 year old woman with diabetic insensate neuropathy who has had chronic ulceration to the great toe and second toe. Ulcerations now probe to bone. Patient has failed prolonged conservative therapy.  Past Medical History  Diagnosis Date  . Mild aortic stenosis     a. 06/2011 Echo EF 55-60%, Mild AI/AS,   . Morbid obesity   . HTN (hypertension)   . Osteoporosis   . Diaphragm dysfunction   . DOE (dyspnea on exertion)   . Hiatal hernia   . GERD (gastroesophageal reflux disease)   . Osteoarthritis   . Asthma   . Palpitations   . Hyperlipidemia   . 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 Multiple thyroid nodules     have been drained x 1  . Diverticulosis   . Chronic kidney disease   . Diabetes mellitus     dx 10 yrs ago  . Pneumonia     hx of  . Dysrhythmia     irregular heart rate in the past  . Colon polyps     Past Surgical History  Procedure Laterality Date  . Total knee arthroplasty      BILATERAL  . Appendectomy    . Bilateral oophorectomy    . Finger surgery      LEFT MIDDLE FINGER  . Mass excision      BACK  . Dilation and curettage of uterus      SEVERAL  . Biopsy thyroid    . Bilateral knee replacements    . Left shoulder replacement    . Right shoulder repair    . Left fot 3rd toe surgery    . Amputation left 3rd toe    . Joint replacement      bilateral total knees  . Bilateral cataracts    . Toe amputation  07/28/2012    RIGHT GREAT TOE  . Amputation  07/28/2012    Procedure: AMPUTATION DIGIT;  Surgeon: Nadara Mustard, MD;  Location: [facility] OR;  Service: Orthopedics;  Laterality: Right;  Right Great Toe Amputation  . Eye surgery    . Toe amputation Right     Big toe  . Amputation Right 01/14/2013    Procedure: AMPUTATION DIGIT;  Surgeon: Nadara Mustard, MD;  Location: [facility] OR;  Service: Orthopedics;   Laterality: Right;  Amputation Right Foot 2 & 3rd Toe MTP Joint  . Colonoscopy      Family History  Problem Relation Age of Onset  . Alzheimer's disease Father   . Heart failure Father   . Coronary artery disease Father   . Diabetes Father   . Coronary artery disease Mother   . Hypertension Mother   . Stroke Mother   . Diabetes Mother   . Coronary artery disease Brother   . Diabetes Brother   . Hypertension Brother   . Heart attack Sister   . Diabetes Sister   . Hypertension Sister   . Stroke Daughter   . Deep vein thrombosis Son    Social History:  reports that she has never smoked. She has never used smokeless tobacco. She reports that she does not drink alcohol or use illicit drugs.  Allergies:  Allergies  Allergen Reactions  . Ceclor [Cefaclor] Anaphylaxis, Hives and Nausea And Vomiting  . Latex Swelling  . Penicillins Anaphylaxis  and Rash  . Dilaudid [Hydromorphone Hcl] Hives  . Hydrocodone Nausea And Vomiting and Other (See Comments)    disorientation  . Lyrica [Pregabalin] Swelling  . Neurontin [Gabapentin] Swelling  . Oxycodone Nausea And Vomiting and Other (See Comments)    disorientation  . Robaxin [Methocarbamol] Nausea And Vomiting  . Streptomycin Hives and Nausea And Vomiting  . Terramycin Nausea And Vomiting  . Vicodin [Hydrocodone-Acetaminophen] Hives  . Sulfa Drugs Cross Reactors Rash  . Zocor [Simvastatin - High Dose] Rash    No prescriptions prior to admission    No results found for this or any previous visit (from the past 48 hour(s)). No results found.  Review of Systems  All other systems reviewed and are negative.    There were no vitals taken for this visit. Physical Exam  On examination patient has palpable pulses. She is ulceration of the great toe and second toe which probes to bone. There is ulceration and sausage digit swelling of the toes. Assessment/Plan Assessment: [place] myelitis ulceration and cellulitis left foot great toe  and second toe.  Plan: Will plan for amputation of the great toe and second toe. Risks and benefits were discussed including infection neurovascular injury nonhealing of the wound need for additional surgery. Patient states she understands and wished to proceed at this time.  [name] 08/03/2013, 6:37 AM

## 2013-08-03 NOTE — Anesthesia Preprocedure Evaluation (Addendum)
Anesthesia Evaluation  Patient identified by MRN, date of birth, ID band Patient awake    Reviewed: Allergy & Precautions, H&P , NPO status , Patient's Chart, lab work & pertinent test results, reviewed documented beta blocker date and time   Airway Mallampati: II TM Distance: >3 FB Neck ROM: Full    Dental  (+) Partial Upper and Partial Lower   Pulmonary shortness of breath, asthma ,          Cardiovascular hypertension, Pt. on medications and Pt. on home beta blockers + DOE + Valvular Problems/Murmurs (mild AS) AS Rhythm:Regular Rate:Bradycardia     Neuro/Psych    GI/Hepatic hiatal hernia, GERD-  ,  Endo/Other  diabetes, Type 2, Insulin Dependent  Renal/GU Renal InsufficiencyRenal diseaseCreatinine 0.72     Musculoskeletal   Abdominal   Peds  Hematology   Anesthesia Other Findings   Reproductive/Obstetrics                           Anesthesia Physical Anesthesia Plan  ASA: III  Anesthesia Plan: General   Post-op Pain Management:    Induction: Intravenous  Airway Management Planned: LMA  Additional Equipment:   Intra-op Plan:   Post-operative Plan: Extubation in OR  Informed Consent: I have reviewed the patients History and Physical, chart, labs and discussed the procedure including the risks, benefits and alternatives for the proposed anesthesia with the patient or authorized representative who has indicated his/her understanding and acceptance.   Dental advisory given  Plan Discussed with: Surgeon and CRNA  Anesthesia Plan Comments:        Anesthesia Quick Evaluation

## 2013-08-03 NOTE — Progress Notes (Signed)
Orthopedic Tech Progress Note Patient Details:  [name] [birth year] [id number]  Ortho Devices Type of Ortho Device: Postop shoe/boot Ortho Device/Splint Interventions: Application   Shawnie Pons 08/03/2013, 10:48 AM

## 2013-08-03 NOTE — Transfer of Care (Signed)
Immediate Anesthesia Transfer of Care Note  Patient: [name]  Procedure(s) Performed: Procedure(s) with comments: AMPUTATION DIGIT (Left) - Left Foot Amputation Great Toe and 2nd Toe MTP joint  Patient Location: PACU  Anesthesia Type:General  Level of Consciousness: awake, alert  and patient cooperative  Airway & Oxygen Therapy: Patient Spontanous Breathing  Post-op Assessment: Report given to PACU RN and Post -op Vital signs reviewed and stable  Post vital signs: Reviewed and stable  Complications: No apparent anesthesia complications

## 2013-08-04 ENCOUNTER — Encounter (HOSPITAL_COMMUNITY): Payer: Self-pay | Admitting: Orthopedic Surgery

## 2013-08-04 LAB — GLUCOSE, CAPILLARY
Glucose-Capillary: 135 mg/dL — ABNORMAL HIGH (ref 70–99)
Glucose-Capillary: 114 mg/dL — ABNORMAL HIGH (ref 70–99)

## 2013-08-04 MED ORDER — PHENOL 1.4 % MT LIQD: 1.0000 | OROMUCOSAL | Status: DC | PRN

## 2013-08-04 NOTE — Evaluation (Addendum)
Physical Therapy Evaluation Patient Details Name: [name] MRN: [id number] DOB: 1941/04/16 Today's Date: 08/04/2013 Time: 0942-1020 PT Time Calculation (min): 38 min  PT Assessment / Plan / Recommendation History of Present Illness  72 y/o pt s/p amputation of the great toe and second toe left foot at the MTP joint. TDWB Lt. LE  Clinical Impression  Pt ambulating well, supervision with RW. Needs cues to maintain TDWB Lt. LE however. Pt reports she has 24 hour assist for first week at home, recommend HHPT. Pt will also benefit from a wheelchair for long distances and to ensure proper healing of surgical incision. Will continue to follow acutely for gait and balance deficits.     PT Assessment  Patient needs continued PT services    Follow Up Recommendations  Home health PT;Supervision/Assistance - 24 hour          Equipment Recommendations  Wheelchair (measurements PT);Wheelchair cushion (measurements PT) (18" x 18" pt confident this is wide enough) Pt will only be safe utilizing RW for ambulation as she will not be safe on crutches due to very poor balance secondary to peripheral neuropathy. Crutches will place pt at a high risk for falls and serious injury.        Frequency Min 3X/week    Precautions / Restrictions Precautions Precaution Comments: Amputation Lt. toe Required Braces or Orthoses: Other Brace/Splint (post-op boot) Restrictions Weight Bearing Restrictions: Yes LLE Weight Bearing: Partial weight bearing LLE Partial Weight Bearing Percentage or Pounds: Touch down   Pertinent Vitals/Pain 5/10 Lt. LE Surgical wound, encouraged elevation       Mobility  Transfers Transfers: Sit to Stand;Stand to Sit Sit to Stand: 5: Supervision Stand to Sit: 5: Supervision Details for Transfer Assistance: Cues for weight bearing precautions Ambulation/Gait Ambulation/Gait Assistance: 5: Supervision Ambulation Distance (Feet): 55 Feet Assistive device: Rolling  walker Ambulation/Gait Assistance Details: Safe slow speed, no overt losses of balance. Cues needed to maintain weight bearing precautions, utilize UEs to decrease weight through Lt. foot Gait Pattern: Step-to pattern;Decreased stance time - left;Trunk flexed Stairs: No Wheelchair Mobility Wheelchair Mobility: No        PT Diagnosis: Difficulty walking;Abnormality of gait;Generalized weakness;Acute pain  PT Problem List: Decreased strength;Decreased activity tolerance;Decreased balance;Decreased mobility;Impaired sensation;Decreased knowledge of precautions;Decreased knowledge of use of DME PT Treatment Interventions: Gait training;DME instruction;Stair training;Functional mobility training;Therapeutic activities;Therapeutic exercise;Balance training;Neuromuscular re-education;Patient/family education;Wheelchair mobility training     PT Goals(Current goals can be found in the care plan section) Acute Rehab PT Goals Patient Stated Goal: Go home PT Goal Formulation: With patient Time For Goal Achievement: 08/11/13 Potential to Achieve Goals: Good  Visit Information  Last PT Received On: 08/04/13 Assistance Needed: +1 History of Present Illness: 72 y/o pt s/p amputation of the great toe and second toe left foot at the MTP joint. TDWB Lt. 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 LEs) Cervical / Trunk Assessment Cervical / Trunk Assessment: Kyphotic   Balance Balance Balance Assessed: Yes Static Standing Balance Rhomberg - Eyes Opened: 20 (sec) Rhomberg - Eyes Closed: 5 (sec - loses balance immediately )  End of Session PT - End of Session Equipment Utilized During Treatment: Other (comment) (surgical boot) Activity Tolerance: Patient tolerated treatment well Patient left: in chair;with call bell/phone within reach;with nursing/sitter in room  GP Functional Limitation: Mobility: Walking and moving around Mobility: Walking and Moving Around Current Status (Z6109): At least 20 percent but less than 40 percent impaired, limited or restricted Mobility: Walking and Moving Around Goal Status [phone]): At least 1 percent but less than 20 percent impaired, limited or restricted   Wilhemina Bonito 08/04/2013, 10:29 AM

## 2013-08-04 NOTE — Progress Notes (Signed)
Patient discharged to home accompanied by family. Discharge instructions and rx given and explained and patient stated understanding. Patients IV was removed and she left unit in a stable condition via wheelchair.

## 2013-08-04 NOTE — Care Management Utilization Note (Signed)
Utilization review completed. Gregery Walberg, RN BSN 

## 2013-08-04 NOTE — Discharge Summary (Signed)
Physician Discharge Summary  Patient ID: [name] MRN: [id number] DOB/AGE: [birth year] 72 y.o.  Admit date: 08/03/2013 Discharge date: 08/04/2013  Admission Diagnoses: Osteomyelitis ulceration left foot great toe and second toe  Discharge Diagnoses: Osteomyelitis ulceration left foot great toe and second toe Active Problems:   * No active hospital problems. *   Discharged Condition: stable  Hospital Course: Patient's hospital course was essentially unremarkable. She underwent amputation of the great toe and second toe left foot at the MTP joint. Postoperatively patient progressed well and was discharged to home.  Consults: None  Significant Diagnostic Studies: labs: Routine labs  Treatments: surgery: See operative note  Discharge Exam: Blood pressure 138/57, pulse 73, temperature 99.1 F (37.3 C), temperature source Oral, resp. rate 18, height 5' 2.5" (1.588 m), weight 98.884 kg (218 lb), SpO2 93.00%. Incision/Wound: dressing clean dry and intact  Disposition: 01-Home or Self Care     Medication List         albuterol 108 (90 BASE) MCG/ACT inhaler  Commonly known as:  PROVENTIL HFA;VENTOLIN HFA  Inhale 2 puffs into the lungs 3 (three) times daily as needed for wheezing.     Alpha-Lipoic Acid 600 MG Caps  Take 600 mg by mouth daily.     aspirin EC 81 MG tablet  Take 81 mg by mouth daily.     CALCIUM CITRATE + D PO  Take 1 tablet by mouth 2 (two) times daily. 750 mg calcium citrate + 500 units vitamin D     carvedilol 12.5 MG tablet  Commonly known as:  COREG  Take 6.25 mg by mouth at bedtime.     CoQ-10 100 MG Caps  Take 100 mg by mouth 2 (two) times daily. Hold While in hospital     furosemide 40 MG tablet  Commonly known as:  LASIX  Take 20 mg by mouth daily.     hydrochlorothiazide 25 MG tablet  Commonly known as:  HYDRODIURIL  Take 25 mg by mouth Daily.     irbesartan 300 MG tablet  Commonly known as:  AVAPRO  Take 300 mg by mouth daily.     metFORMIN 500 MG tablet  Commonly known as:  GLUCOPHAGE  Take 500 mg by mouth 2 (two) times daily with a meal.     multivitamin with minerals Tabs tablet  Take 1 tablet by mouth daily.     Omega 3-6-9 Fatty Acids Caps  Take 1 capsule by mouth daily. 1000 mg     omeprazole 20 MG capsule  Commonly known as:  PRILOSEC  Take 20 mg by mouth daily.     potassium chloride SA 20 MEQ tablet  Commonly known as:  K-DUR,KLOR-CON  Take 20 mEq by mouth 2 (two) times daily.     traMADol 50 MG tablet  Commonly known as:  ULTRAM  Take 1 tablet (50 mg total) by mouth every 6 (six) hours as needed for pain. Maximum dose= 8 tablets per day     traMADol 50 MG tablet  Commonly known as:  ULTRAM  Take 50 mg by mouth 2 (two) times daily as needed for pain.           Follow-up Information   Follow up with Ray Gervasi V, MD In 2 weeks.   Specialty:  Orthopedic Surgery   Contact information:   [address] [place] [address] [phone]       Signed: Nadara Mustard 08/04/2013, 6:21 AM

## 2013-08-04 NOTE — Progress Notes (Signed)
08/04/13 Spoke with patient about HHC. She selected [facility] from [facility] agencies list. Cena Benton at [facility] [phone] and set up HHPT and [facility], services to start Saturday 08/06/13. Faxed orders, H and P, d/c summary , face sheet and PT note to [phone]. Received  confirmation.[employer] to deliver wheelchair to patient's room prior to d/c. Jacquelynn Cree RN, BSN, CCM

## 2014-02-28 IMAGING — CR DG CHEST 2V
2 series · 2 of 2 positions shown · non-contrast
Comparison: Chest x-ray 01/25/2011.

CLINICAL DATA: Amputation.

CHEST - 2 VIEW

[view not recorded (1 of 2)]
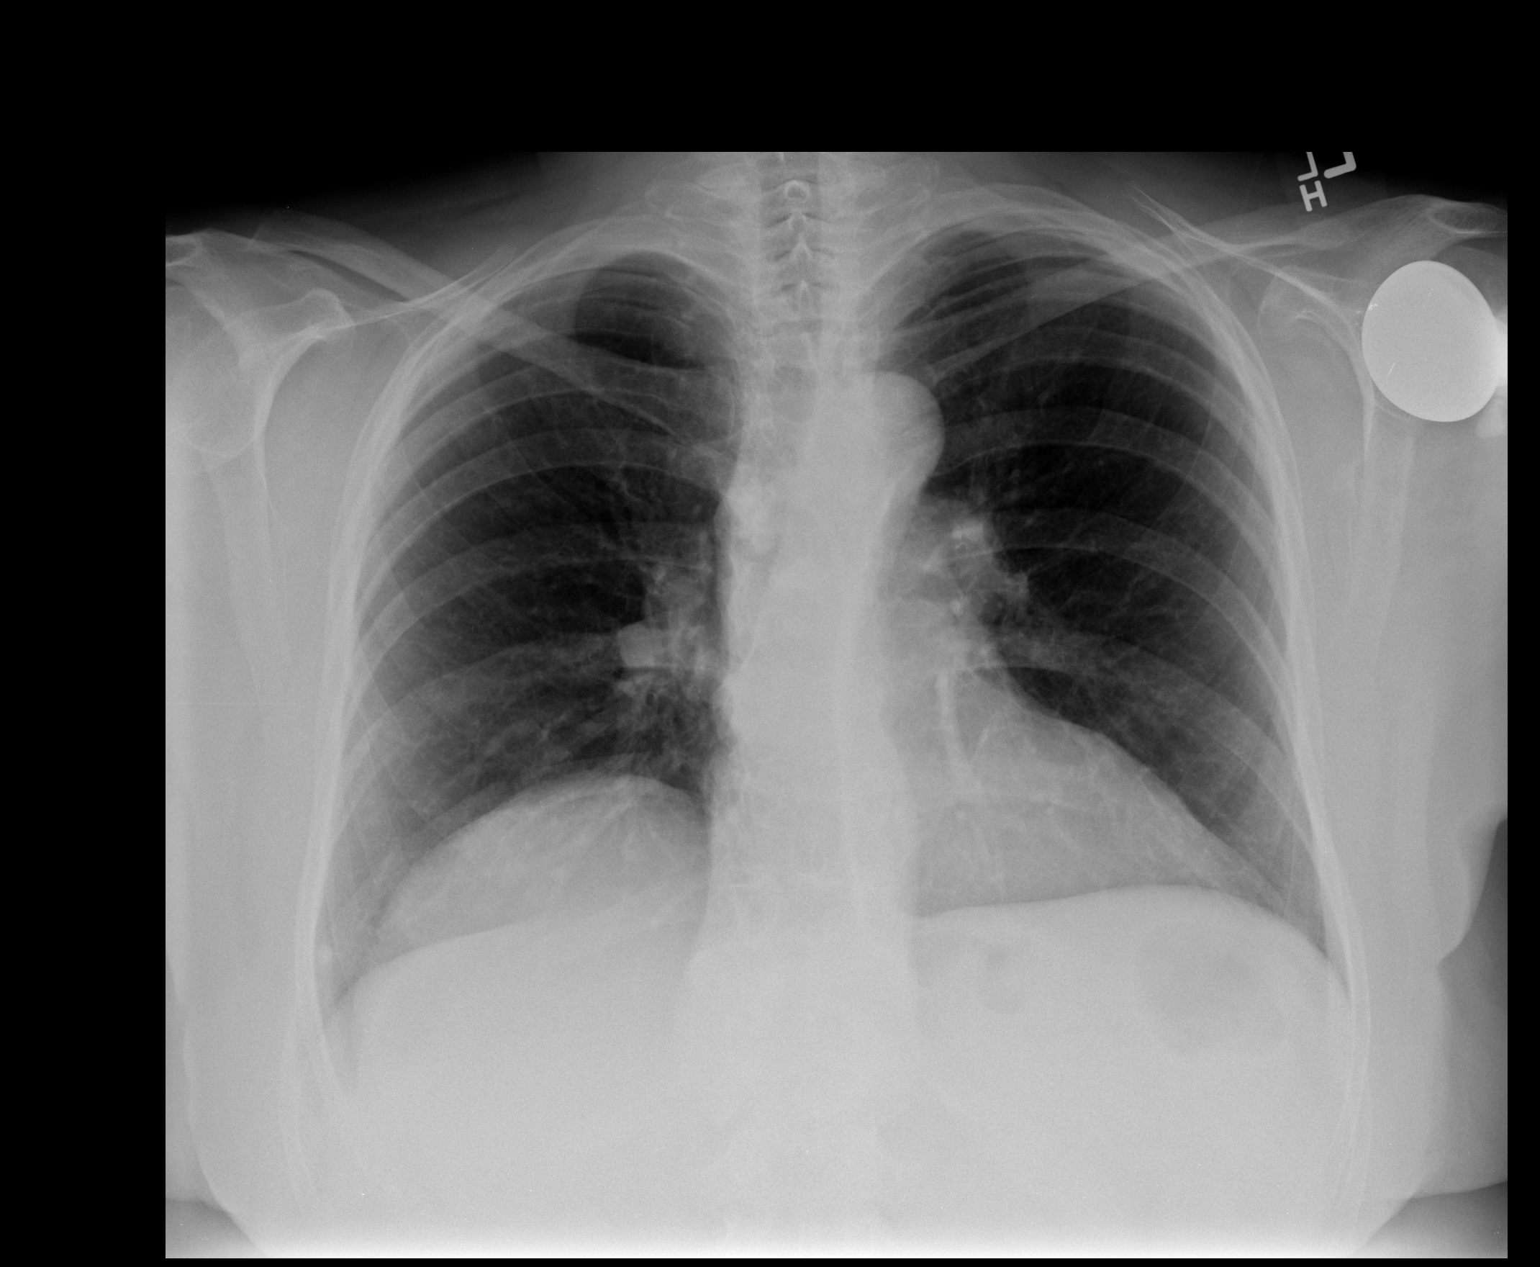

[view not recorded (2 of 2)]
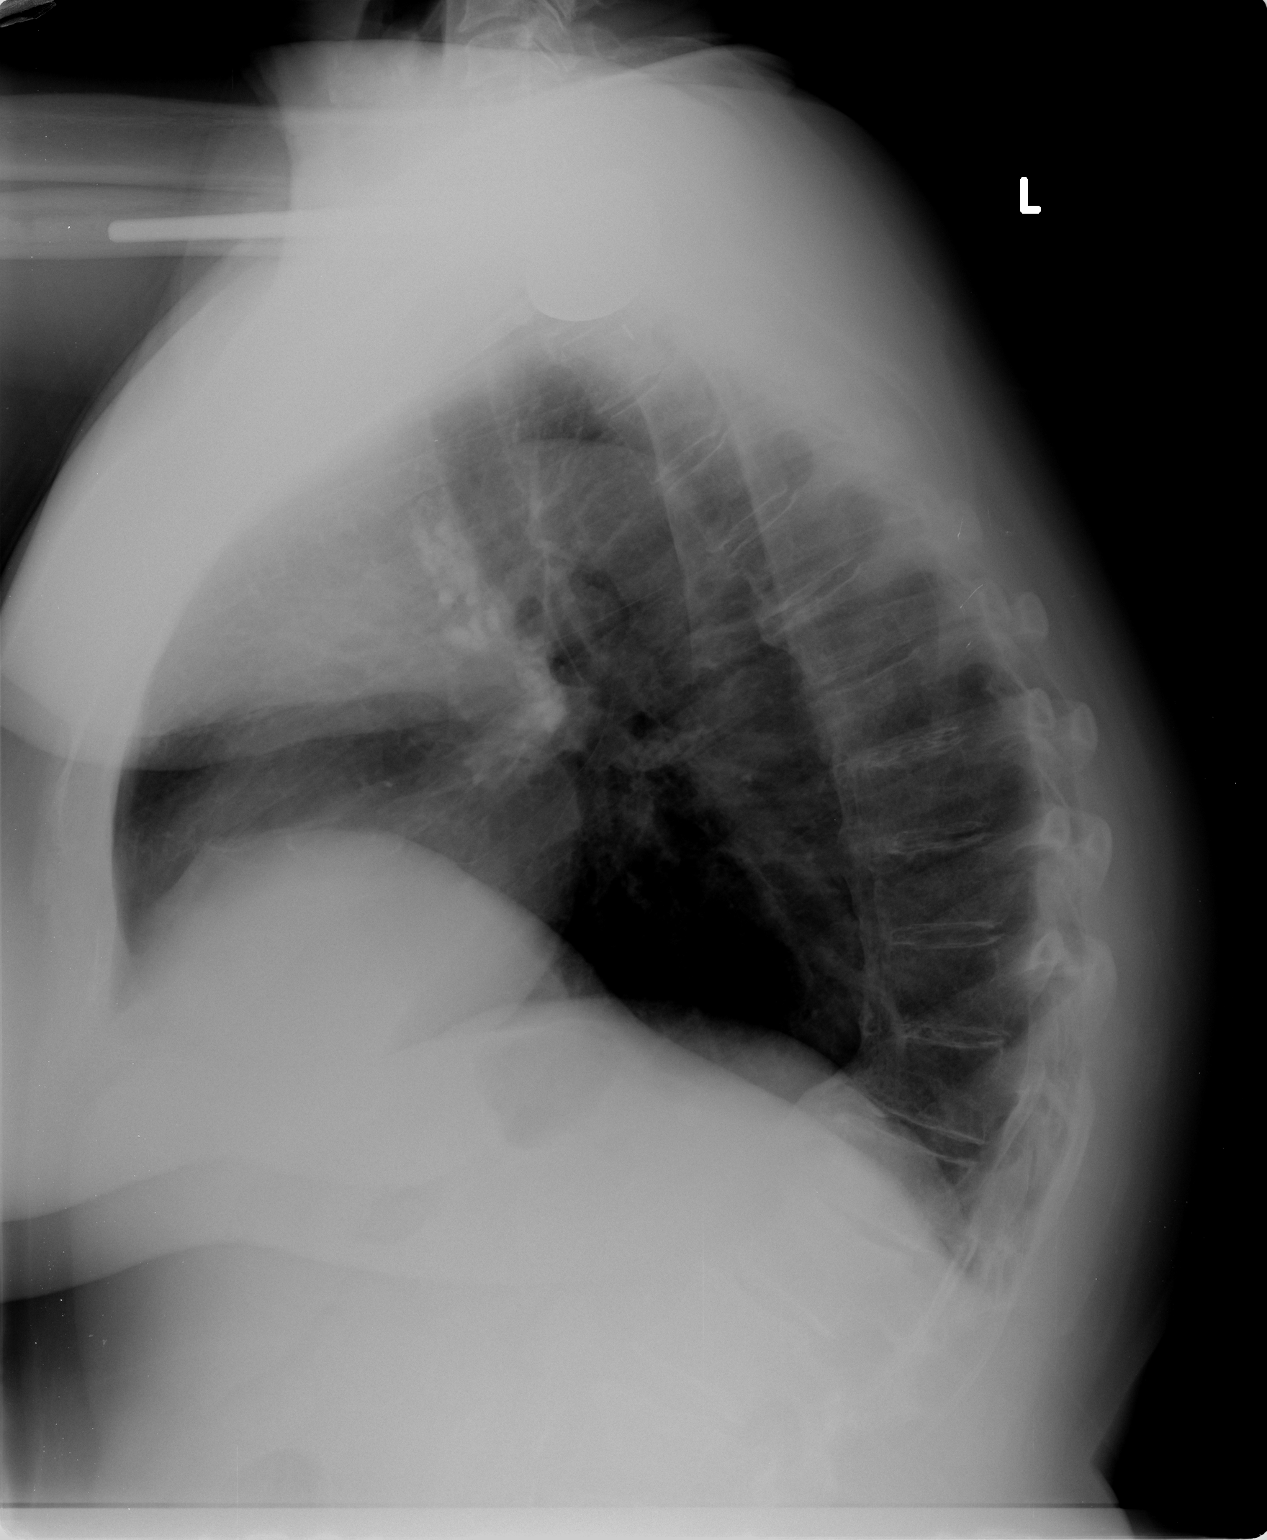

[2 of 2 positions shown; findings below may reference images not displayed]

FINDINGS: Lung volumes are low.  No acute consolidative airspace
disease.  No pleural effusions.  Small dense nodule projecting over
the lateral aspect of the lower right hemithorax likely represents
a calcified granuloma (unchanged).  No other larger more suspicious
appearing pulmonary nodules or masses are otherwise identified.
Heart size is normal.  Mediastinal contours are unremarkable.
Atherosclerosis in the thoracic aorta.  Left shoulder
hemiarthroplasty. Multiple calcified mediastinal lymph nodes are
again noted, compatible with old granulomatous disease.
IMPRESSION: 1.  Low lung volumes without radiographic evidence of acute
cardiopulmonary disease.
2.  Atherosclerosis.
3.  Sequelae of old granulomatous disease, as above.

## 2014-07-21 ENCOUNTER — Other Ambulatory Visit: Payer: Self-pay

## 2014-10-02 ENCOUNTER — Encounter: Payer: Self-pay | Admitting: Cardiology

## 2014-10-02 ENCOUNTER — Ambulatory Visit (INDEPENDENT_AMBULATORY_CARE_PROVIDER_SITE_OTHER): Payer: Commercial Managed Care - HMO | Admitting: Cardiology

## 2014-10-02 VITALS — BP 158/82 | HR 65 | Ht 62.5 in | Wt 216.1 lb

## 2014-10-02 DIAGNOSIS — R55 Syncope and collapse: Secondary | ICD-10-CM

## 2014-10-02 DIAGNOSIS — I350 Nonrheumatic aortic (valve) stenosis: Secondary | ICD-10-CM

## 2014-10-02 DIAGNOSIS — I10 Essential (primary) hypertension: Secondary | ICD-10-CM

## 2014-10-02 NOTE — Patient Instructions (Signed)
Your physician recommends that you continue on your current medications as directed. Please refer to the Current Medication list given to you today.  Your physician has requested that you have an echocardiogram. Echocardiography is a painless test that uses sound waves to create images of your heart. It provides your doctor with information about the size and shape of your heart and how well your heart's chambers and valves are working. This procedure takes approximately one hour. There are no restrictions for this procedure.   Your physician wants you to follow-up in: 6 months with Dr.Nishan You will receive a reminder letter in the mail two months in advance. If you don't receive a letter, please call our office to schedule the follow-up appointment.

## 2014-10-02 NOTE — Assessment & Plan Note (Signed)
Sounds orthostatic, Lasix recently stopped

## 2014-10-02 NOTE — Assessment & Plan Note (Signed)
Controlled.  

## 2014-10-02 NOTE — Progress Notes (Signed)
10/02/2014 [name]   1941/03/08  [id number]  Primary Physician Jerlyn Ly, MD Primary Cardiologist: Dr Johnsie Cancel  HPI:  73 y/o seen by [place] last in 2013. She is followed by Dr Joylene Draft. She has a history of AS- mild by echo Sept 2012. She is here today to see about a follow up echo. She has been doing well from a cardiac standpoint. She lost her husband about a year ago and [place] and her dog. She walks her dog daily without chest pain or SOB. She has not had syncope. She has a chronic history of near syncope "for 15 yrs". It sounds orthostatic. She is able to tell when she is going to have an episode and lays down. She denies any tachycardia or palpitations.    Current Outpatient Prescriptions  Medication Sig Dispense Refill  . ACCU-CHEK FASTCLIX LANCETS MISC     . ACCU-CHEK SMARTVIEW test strip     . albuterol (PROVENTIL HFA;VENTOLIN HFA) 108 (90 BASE) MCG/ACT inhaler Inhale 2 puffs into the lungs 3 (three) times daily as needed for wheezing.     . Alpha-Lipoic Acid 600 MG CAPS Take 600 mg by mouth daily.    Marland Kitchen aspirin EC 81 MG tablet Take 81 mg by mouth daily.    . Calcium Citrate-Vitamin D (CALCIUM CITRATE + D PO) Take 1 tablet by mouth 2 (two) times daily. 750 mg calcium citrate + 500 units vitamin D    . carvedilol (COREG) 12.5 MG tablet Take 6.25 mg by mouth at bedtime.    . Coenzyme Q10 (COQ-10) 100 MG CAPS Take 100 mg by mouth 2 (two) times daily. Hold While in hospital    . furosemide (LASIX) 40 MG tablet Take 20 mg by mouth daily.    . hydrochlorothiazide 25 MG tablet Take 25 mg by mouth Daily.    . irbesartan (AVAPRO) 300 MG tablet Take 300 mg by mouth daily.    . metFORMIN (GLUCOPHAGE) 500 MG tablet Take 500 mg by mouth 2 (two) times daily with a meal.     . Multiple Vitamin (MULTIVITAMIN WITH MINERALS) TABS tablet Take 1 tablet by mouth daily.    [name] 3-6-9 Fatty Acids CAPS Take 1 capsule by mouth daily. 1000 mg    . omeprazole  (PRILOSEC) 20 MG capsule Take 20 mg by mouth daily.     . potassium chloride SA (K-DUR,KLOR-CON) 20 MEQ tablet Take 20 mEq by mouth 2 (two) times daily.     . traMADol (ULTRAM) 50 MG tablet Take 50 mg by mouth 2 (two) times daily as needed for pain.    . traMADol (ULTRAM) 50 MG tablet Take 1 tablet (50 mg total) by mouth every 6 (six) hours as needed for pain. Maximum dose= 8 tablets per day 60 tablet 0   No current facility-administered medications for this visit.    Allergies  Allergen Reactions  . Ceclor [Cefaclor] Anaphylaxis, Hives and Nausea And Vomiting  . Latex Swelling  . Penicillins Anaphylaxis and Rash  . Dilaudid [Hydromorphone Hcl] Hives  . Hydrocodone Nausea And Vomiting and Other (See Comments)    disorientation  . Lyrica [Pregabalin] Swelling  . Neurontin [Gabapentin] Swelling  . Oxycodone Nausea And Vomiting and Other (See Comments)    disorientation  . Robaxin [Methocarbamol] Nausea And Vomiting  . Streptomycin Hives and Nausea And Vomiting  . Terramycin Nausea And Vomiting  . Vicodin [Hydrocodone-Acetaminophen] Hives  . Sulfa Drugs Cross  Reactors Rash  . Zocor [Simvastatin - High Dose] Rash    History   Social History  . Marital Status: Widowed    Spouse Name: N/A    Number of Children: N/A  . Years of Education: N/A   Occupational History  . Not on file.   Social History Main Topics  . Smoking status: Never Smoker   . Smokeless tobacco: Never Used  . Alcohol Use: No  . Drug Use: No  . Sexual Activity: Not Currently   Other Topics Concern  . Not on file   Social History Narrative     Review of Systems: General: negative for chills, fever, night sweats or weight changes.  Cardiovascular: negative for chest pain, dyspnea on exertion, edema, orthopnea, palpitations, paroxysmal nocturnal dyspnea or shortness of breath Dermatological: negative for rash Respiratory: negative for cough or wheezing Urologic: negative for hematuria Abdominal:  negative for nausea, vomiting, diarrhea, bright red blood per rectum, melena, or hematemesis Neurologic: negative for visual changes, syncope, or dizziness All other systems reviewed and are otherwise negative except as noted above.    Blood pressure 158/82, pulse 65, height 5' 2.5" (1.588 m), weight 216 lb 1.9 oz (98.031 kg).  General appearance: alert, cooperative, no distress and mildly obese Neck: no carotid bruit and no JVD Lungs: clear to auscultation bilaterally Heart: regular rate and rhythm and 2/6 systolic murmur, preserved S2  EKG NSR, LAD  ASSESSMENT AND PLAN:   Mild aortic stenosis 06/2011 Echo EF 55-60%, Mild AI/AS, no symptoms  HTN (hypertension) Controlled  Near syncope Sounds orthostatic, Lasix recently stopped    PLAN I ordered an echo. I offered an event monitor to further evaluate her pre syncopal spells but she declined. She admits if she stays hydrated she does better. She is on several antihypertensives. Dr Merryl Hacker recently took her off Lasix. She'll consider further evaluation if this doesn't help.   [name] KPA-C 10/02/2014 1:28 PM

## 2014-10-02 NOTE — Assessment & Plan Note (Signed)
06/2011 Echo EF 55-60%, Mild AI/AS, no symptoms

## 2014-10-12 ENCOUNTER — Other Ambulatory Visit (HOSPITAL_COMMUNITY): Payer: Medicare HMO

## 2014-11-16 ENCOUNTER — Ambulatory Visit (HOSPITAL_COMMUNITY): Payer: Commercial Managed Care - HMO | Attending: Cardiology

## 2014-11-16 DIAGNOSIS — I350 Nonrheumatic aortic (valve) stenosis: Secondary | ICD-10-CM | POA: Insufficient documentation

## 2014-11-16 NOTE — Progress Notes (Signed)
2D Echo completed. 11/16/2014

## 2014-11-28 ENCOUNTER — Telehealth: Payer: Self-pay | Admitting: Cardiovascular Disease

## 2014-11-28 NOTE — Telephone Encounter (Signed)
New message ° ° ° ° ° °Want echo results °

## 2014-11-28 NOTE — Telephone Encounter (Signed)
PT AWARE OF ECHO RESULTS./CY 

## 2015-01-10 DIAGNOSIS — H43813 Vitreous degeneration, bilateral: Secondary | ICD-10-CM | POA: Diagnosis not present

## 2015-01-10 DIAGNOSIS — H524 Presbyopia: Secondary | ICD-10-CM | POA: Diagnosis not present

## 2015-01-10 DIAGNOSIS — H5213 Myopia, bilateral: Secondary | ICD-10-CM | POA: Diagnosis not present

## 2015-01-10 DIAGNOSIS — H52223 Regular astigmatism, bilateral: Secondary | ICD-10-CM | POA: Diagnosis not present

## 2015-01-10 DIAGNOSIS — Z961 Presence of intraocular lens: Secondary | ICD-10-CM | POA: Diagnosis not present

## 2015-02-19 DIAGNOSIS — E785 Hyperlipidemia, unspecified: Secondary | ICD-10-CM | POA: Diagnosis not present

## 2015-02-19 DIAGNOSIS — E1149 Type 2 diabetes mellitus with other diabetic neurological complication: Secondary | ICD-10-CM | POA: Diagnosis not present

## 2015-02-19 DIAGNOSIS — Z6837 Body mass index (BMI) 37.0-37.9, adult: Secondary | ICD-10-CM | POA: Diagnosis not present

## 2015-02-19 DIAGNOSIS — J45909 Unspecified asthma, uncomplicated: Secondary | ICD-10-CM | POA: Diagnosis not present

## 2015-02-19 DIAGNOSIS — I10 Essential (primary) hypertension: Secondary | ICD-10-CM | POA: Diagnosis not present

## 2015-02-19 DIAGNOSIS — G629 Polyneuropathy, unspecified: Secondary | ICD-10-CM | POA: Diagnosis not present

## 2015-03-08 IMAGING — CR DG CHEST 2V
2 series · 2 of 2 positions shown · non-contrast
Comparison: Chest x-ray of 07/26/2012

CLINICAL DATA: Preop for amputation of toes, diabetes, hypertension

EXAM:
CHEST  2 VIEW

[w chest pa]
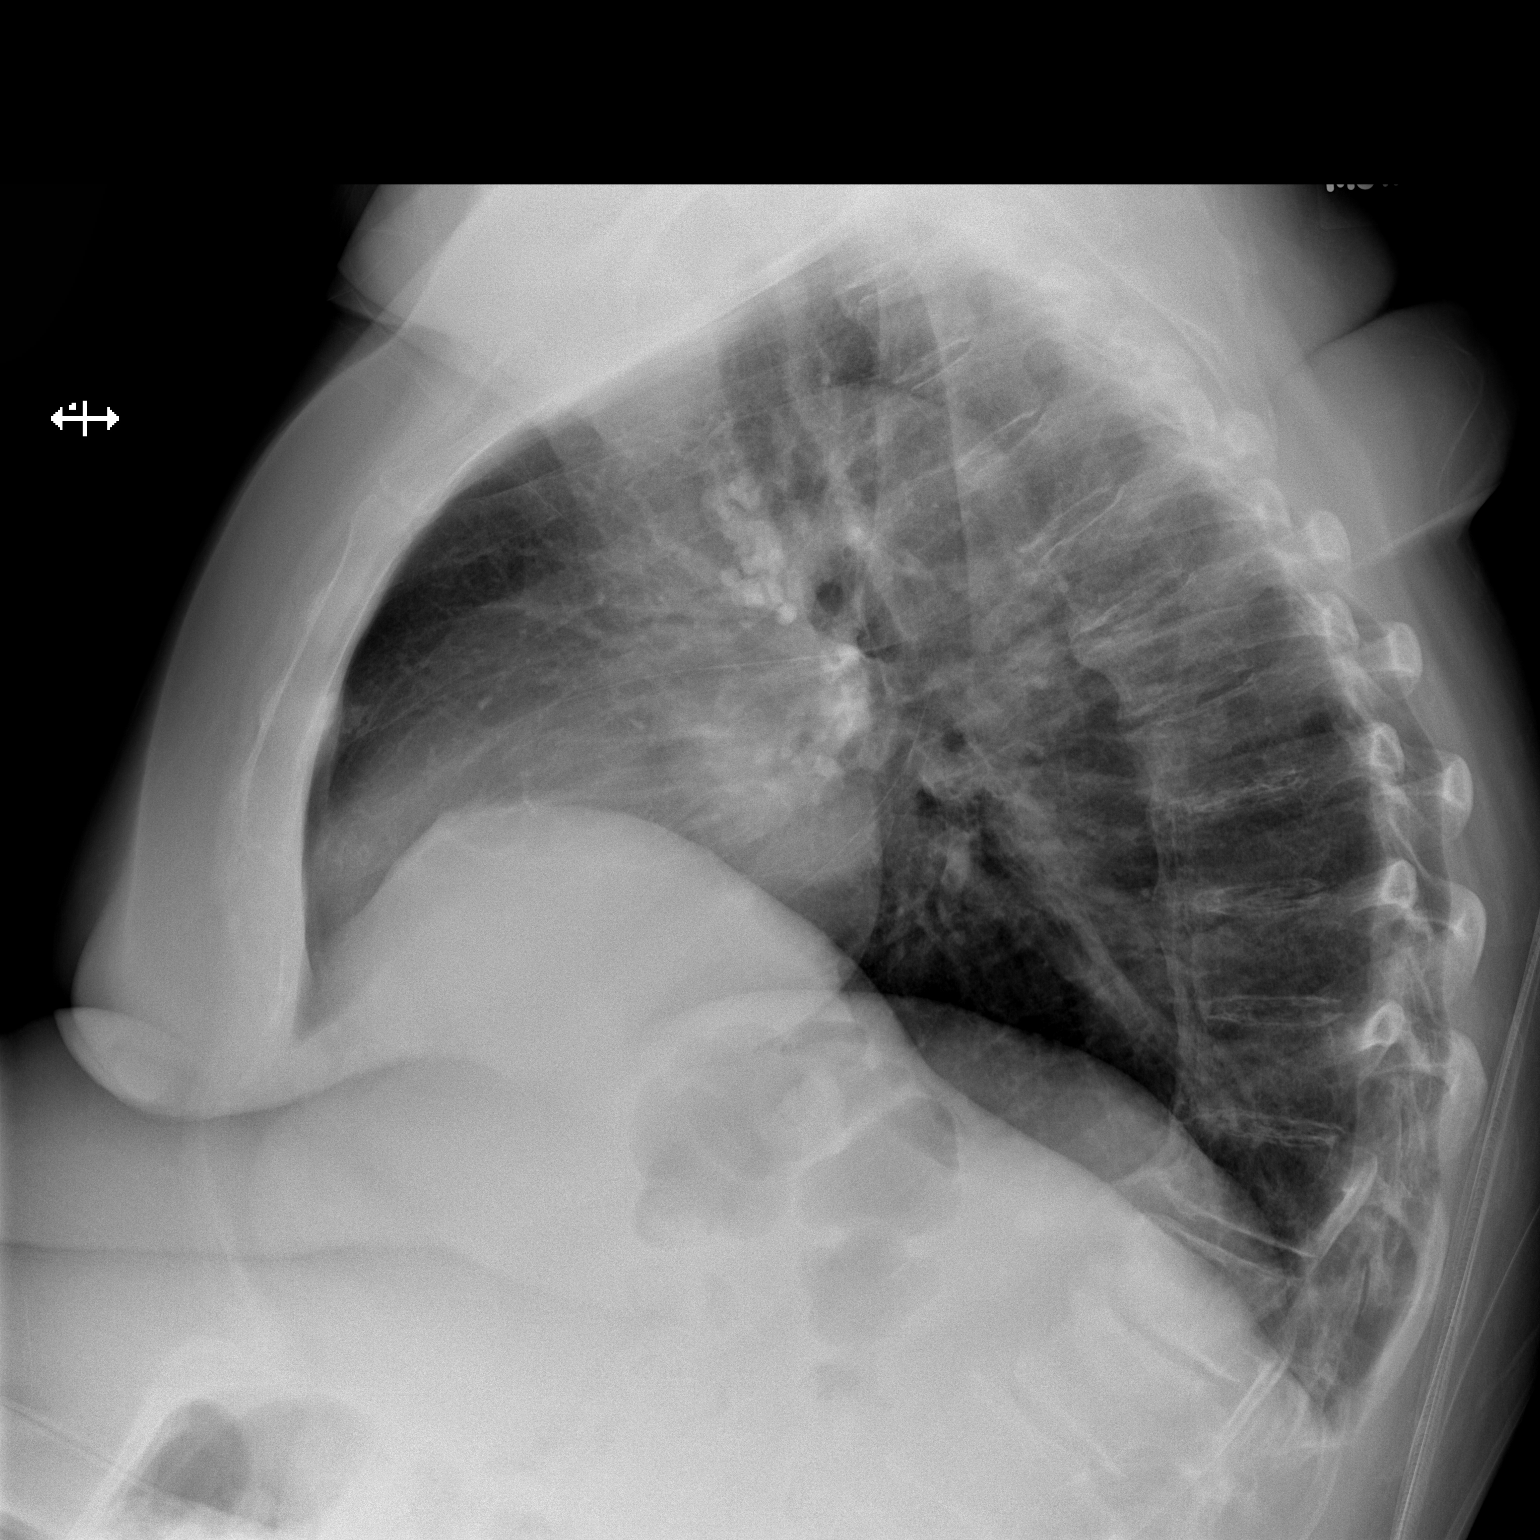

[x chest ap]
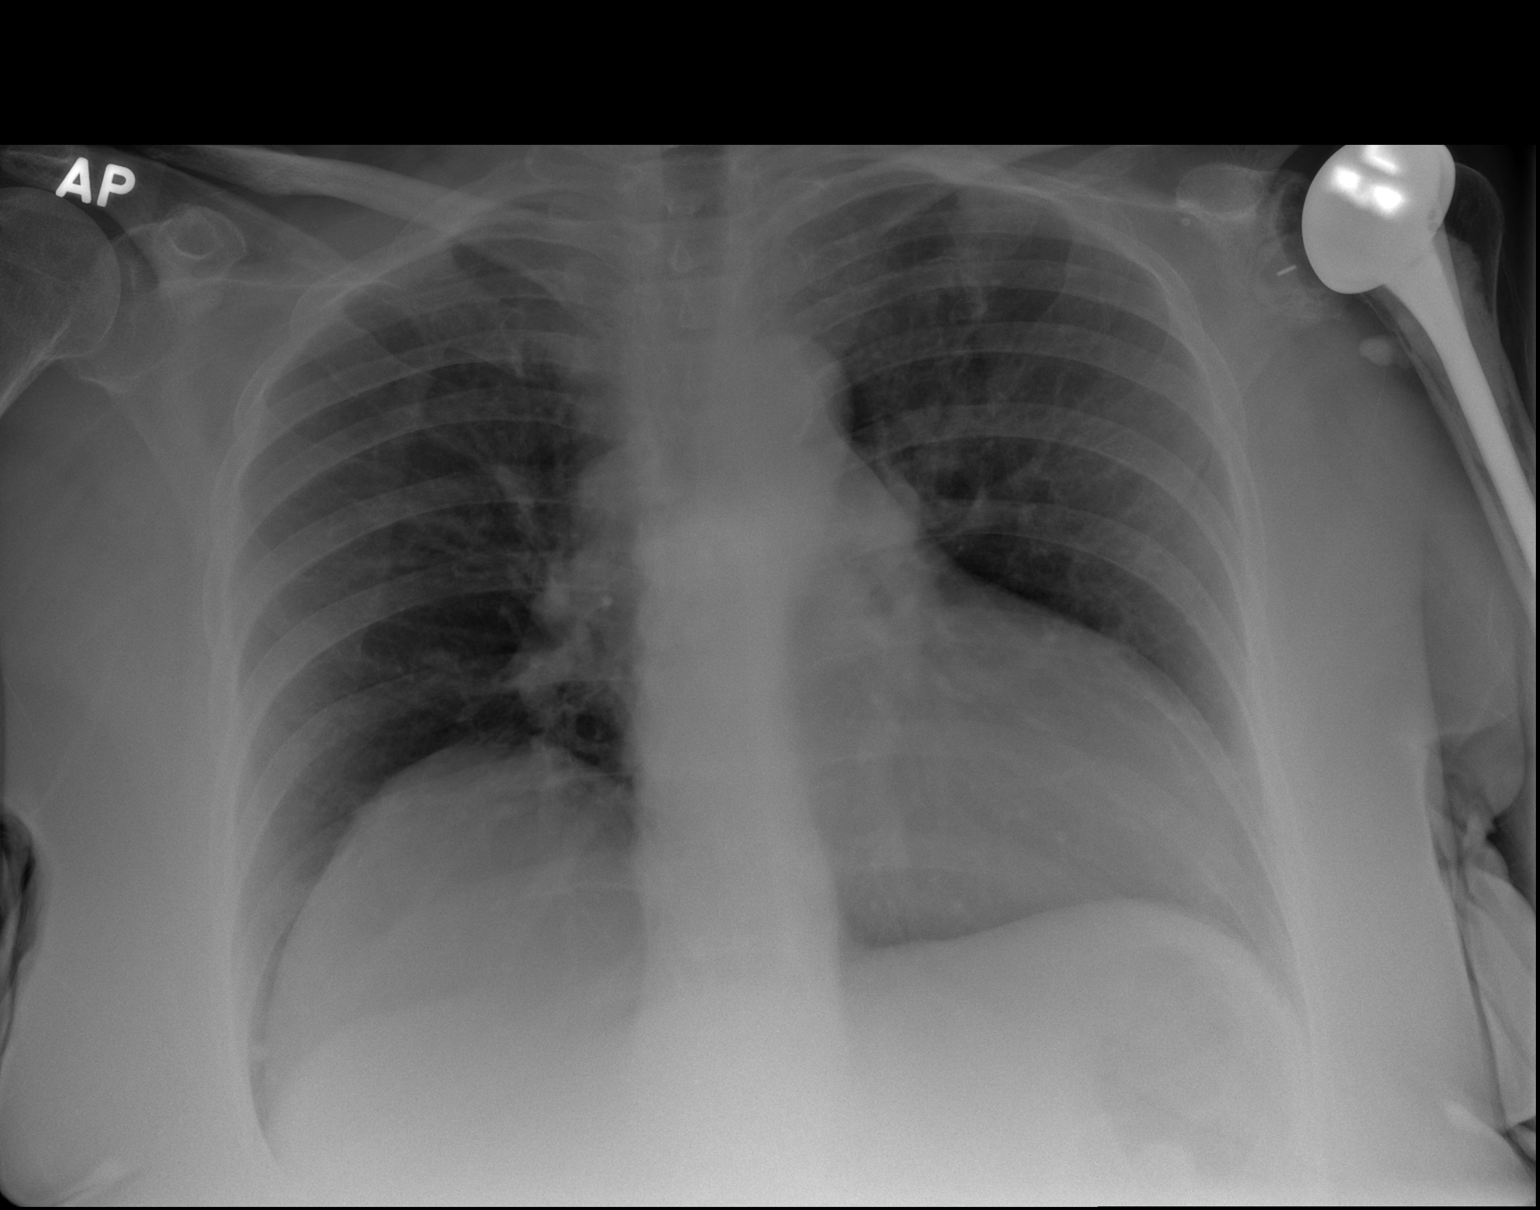

[2 of 2 positions shown; findings below may reference images not displayed]

FINDINGS: The lungs are clear and somewhat hyper aerated. As noted previously
there are calcified mediastinal and hilar nodes as well as a
calcified granuloma in the right lower lobe consistent with prior
granulomatous disease. Mild cardiomegaly is stable. There is a
thoracic kyphosis present and there are diffuse degenerative changes
throughout the thoracic spine. A left humeral head replacement is
noted.
IMPRESSION: Stable cardiomegaly. No active lung disease.

## 2015-04-02 ENCOUNTER — Other Ambulatory Visit: Payer: Self-pay

## 2015-10-12 DIAGNOSIS — E042 Nontoxic multinodular goiter: Secondary | ICD-10-CM | POA: Diagnosis not present

## 2015-10-12 DIAGNOSIS — I10 Essential (primary) hypertension: Secondary | ICD-10-CM | POA: Diagnosis not present

## 2015-10-12 DIAGNOSIS — E1149 Type 2 diabetes mellitus with other diabetic neurological complication: Secondary | ICD-10-CM | POA: Diagnosis not present

## 2015-10-12 DIAGNOSIS — M810 Age-related osteoporosis without current pathological fracture: Secondary | ICD-10-CM | POA: Diagnosis not present

## 2015-10-12 DIAGNOSIS — E784 Other hyperlipidemia: Secondary | ICD-10-CM | POA: Diagnosis not present

## 2015-10-19 DIAGNOSIS — Z0000 Encounter for general adult medical examination without abnormal findings: Secondary | ICD-10-CM | POA: Diagnosis not present

## 2015-10-19 DIAGNOSIS — J45909 Unspecified asthma, uncomplicated: Secondary | ICD-10-CM | POA: Diagnosis not present

## 2015-10-19 DIAGNOSIS — E1149 Type 2 diabetes mellitus with other diabetic neurological complication: Secondary | ICD-10-CM | POA: Diagnosis not present

## 2015-10-19 DIAGNOSIS — M810 Age-related osteoporosis without current pathological fracture: Secondary | ICD-10-CM | POA: Diagnosis not present

## 2015-10-19 DIAGNOSIS — I350 Nonrheumatic aortic (valve) stenosis: Secondary | ICD-10-CM | POA: Diagnosis not present

## 2015-10-19 DIAGNOSIS — M5380 Other specified dorsopathies, site unspecified: Secondary | ICD-10-CM | POA: Diagnosis not present

## 2015-10-19 DIAGNOSIS — E042 Nontoxic multinodular goiter: Secondary | ICD-10-CM | POA: Diagnosis not present

## 2015-10-19 DIAGNOSIS — D649 Anemia, unspecified: Secondary | ICD-10-CM | POA: Diagnosis not present

## 2015-10-19 DIAGNOSIS — G629 Polyneuropathy, unspecified: Secondary | ICD-10-CM | POA: Diagnosis not present

## 2015-10-19 DIAGNOSIS — Z1212 Encounter for screening for malignant neoplasm of rectum: Secondary | ICD-10-CM | POA: Diagnosis not present

## 2015-10-19 DIAGNOSIS — D126 Benign neoplasm of colon, unspecified: Secondary | ICD-10-CM | POA: Diagnosis not present

## 2015-11-06 ENCOUNTER — Ambulatory Visit: Payer: Commercial Managed Care - HMO | Admitting: Physician Assistant

## 2016-01-02 ENCOUNTER — Ambulatory Visit (INDEPENDENT_AMBULATORY_CARE_PROVIDER_SITE_OTHER): Payer: Commercial Managed Care - HMO | Admitting: Internal Medicine

## 2016-01-02 ENCOUNTER — Encounter: Payer: Self-pay | Admitting: Internal Medicine

## 2016-01-02 VITALS — BP 164/76 | HR 60 | Ht 62.0 in | Wt 207.5 lb

## 2016-01-02 DIAGNOSIS — D509 Iron deficiency anemia, unspecified: Secondary | ICD-10-CM | POA: Diagnosis not present

## 2016-01-02 DIAGNOSIS — Z86010 Personal history of colonic polyps: Secondary | ICD-10-CM | POA: Diagnosis not present

## 2016-01-02 DIAGNOSIS — R197 Diarrhea, unspecified: Secondary | ICD-10-CM

## 2016-01-02 DIAGNOSIS — K219 Gastro-esophageal reflux disease without esophagitis: Secondary | ICD-10-CM

## 2016-01-02 NOTE — Progress Notes (Signed)
HISTORY OF PRESENT ILLNESS:  [name] is a 75 y.o. female who is referred by Dr. Crist Infante regarding anemia, likely iron deficient. The patient has a history of adenomatous colon polyps with index examination 2005, follow-up 2007, and most recent examination July 2012. 2 adenomas removed at that time. Follow-up in 5 years recommended. She has not been seen since. Patient reports that she underwent her routine annual evaluation which included blood work. I have secured a copy of blood work and it has been reviewed. On 10/12/2015 CBC revealed anemia with hemoglobin 10.9. MCV 82.9. Other blood counts and chemistries were unremarkable. Hemoglobin A1c 6.2. Stool Hemoccult testing was negative. She did undergo additional testing including normal B12 level. Iron studies revealed low iron saturation of 13%. The patient is now referred. Her chief complaint is been fatigue. She does report problems with abdominal pain and constipation secondary to tramadol. Previously lifelong problems with diarrhea. She tells me that gluten-free diet helped though she was evaluated by GI elsewhere who said that she did not have celiac disease. Thus, off gluten-free diet. The patient continues to wonder about reflux. She also has a history of GERD for which she is on chronic PPI in the form of omeprazole. Good control symptoms on medication. GI review of systems is otherwise negative. REVIEW OF SYSTEMS:  All non-GI ROS negative except for sinus allergy, arthritis, back pain, itching  Past Medical History  Diagnosis Date  . Mild aortic stenosis     a. 06/2011 Echo EF 55-60%, Mild AI/AS,   . Morbid obesity ([place])   . HTN (hypertension)   . Osteoporosis   . Diaphragm dysfunction   . DOE (dyspnea on exertion)   . Hiatal hernia   . GERD (gastroesophageal reflux disease)   . Osteoarthritis   . Asthma   . Palpitations   . Hyperlipidemia   . 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 Multiple  thyroid nodules     have been drained x 1  . Diverticulosis   . Chronic kidney disease   . Diabetes mellitus     dx 10 yrs ago  . Pneumonia     hx of  . Dysrhythmia     irregular heart rate in the past  . Colon polyps     Past Surgical History  Procedure Laterality Date  . Total knee arthroplasty      BILATERAL  . Appendectomy    . Bilateral oophorectomy    . Finger surgery      LEFT MIDDLE FINGER  . Mass excision      BACK  . Dilation and curettage of uterus      SEVERAL  . Biopsy thyroid    . Bilateral knee replacements    . Left shoulder replacement    . Right shoulder repair    . Left fot 3rd toe surgery    . Amputation left 3rd toe    . Joint replacement      bilateral total knees  . Bilateral cataracts    . Toe amputation  07/28/2012    RIGHT GREAT TOE  . Amputation  07/28/2012    Procedure: AMPUTATION DIGIT;  Surgeon: Newt Minion, MD;  Location: [facility];  Service: Orthopedics;  Laterality: Right;  Right Great Toe Amputation  . Eye surgery    . Toe amputation Right     Big toe  . Amputation Right 01/14/2013    Procedure: AMPUTATION  DIGIT;  Surgeon: Newt Minion, MD;  Location: [facility];  Service: Orthopedics;  Laterality: Right;  Amputation Right Foot 2 & 3rd Toe MTP Joint  . Colonoscopy    . Toe amputation  08/03/2013    LEFT FOOT GREAT TOE & SECOND TOE     DR DUDA  . Amputation Left 08/03/2013    Procedure: AMPUTATION DIGIT;  Surgeon: Newt Minion, MD;  Location: [facility];  Service: Orthopedics;  Laterality: Left;  Left Foot Amputation Great Toe and 2nd Toe MTP joint    Social History ONA TELLEFSEN  reports that she has never smoked. She has never used smokeless tobacco. She reports that she does not drink alcohol or use illicit drugs.  family history includes Alzheimer's disease in her father; Coronary artery disease in her brother, father, and mother; Deep vein thrombosis in her son; Diabetes in her brother, father, mother, and sister; Heart attack in her  sister; Heart failure in her father; Hypertension in her brother, mother, and sister; Stroke in her daughter and mother.  Allergies  Allergen Reactions  . Ceclor [Cefaclor] Anaphylaxis, Hives and Nausea And Vomiting  . Latex Swelling  . Penicillins Anaphylaxis and Rash  . Dilaudid [Hydromorphone Hcl] Hives  . Hydrocodone Nausea And Vomiting and Other (See Comments)    disorientation  . Lyrica [Pregabalin] Swelling  . Neurontin [Gabapentin] Swelling  . Oxycodone Nausea And Vomiting and Other (See Comments)    disorientation  . Robaxin [Methocarbamol] Nausea And Vomiting  . Streptomycin Hives and Nausea And Vomiting  . Terramycin Nausea And Vomiting  . Vicodin [Hydrocodone-Acetaminophen] Hives  . Sulfa Drugs Cross Reactors Rash  . Zocor [Simvastatin - High Dose] Rash       PHYSICAL EXAMINATION: Vital signs: BP 164/76 mmHg  Pulse 60  Ht 5\' 2"  (1.575 m)  Wt 207 lb 8 oz (94.121 kg)  BMI 37.94 kg/m2  Constitutional: generally well-appearing, no acute distress. Moves slowly Psychiatric: alert and oriented x3, cooperative Eyes: extraocular movements intact, anicteric, conjunctiva pink Mouth: oral pharynx moist, no lesions Neck: supple without thyromegaly Lymph: no lymphadenopathy Cardiovascular: heart regular rate and rhythm, soft systolic murmur Lungs: clear to auscultation bilaterally Abdomen: soft, obese, nontender, nondistended, no obvious ascites, no peritoneal signs, normal bowel sounds, no organomegaly Rectal: Deferred until colonoscopy Extremities: no clubbing cyanosis or lower extremity edema bilaterally Skin: no lesions on visible extremities Neuro: No focal deficits. Normal DTRs. Cranial nerves intact   ASSESSMENT:  #1. Iron deficiency anemia. Rule out GI mucosal disorders. Rule out absorptive disorder #2. Alternating bowel habits. Rule out IBS. Rule out medication effect. Rule out celiac #3. GERD. Symptoms controlled on PPI currently #4. Multiple medical problems  including diabetes mellitus. Need to manage medication for her procedures #5. Personal history of adenomatous colon polyps. Last colonoscopy July 2012. Due for surveillance shortly   PLAN:  #1. Colonoscopy for colorectal neoplasia surveillance and to evaluate altered bowel habits and iron deficiency anemia. The nature of the procedure, as well as the risks, benefits, and alternatives were carefully and thoroughly reviewed with the patient. Ample time for discussion and questions allowed. The patient understood, was satisfied, and agreed to proceed. #2. Upper endoscopy with duodenal biopsies to evaluate iron deficiency anemia and question of celiac. #3. Hold diabetic medications the day of the procedure to avoid and wanted hypoglycemiaThe nature of the procedure, as well as the risks, benefits, and alternatives were carefully and thoroughly reviewed with the patient. Ample time for discussion and questions allowed. The  patient understood, was satisfied, and agreed to proceed. #4. Celiac panel #5. Reflux precautions #6. Continue PPI #7. Ongoing general medical care with Dr. Joylene Draft

## 2016-01-02 NOTE — Patient Instructions (Signed)
You have been scheduled for a colonoscopy/EGD. Please follow written instructions given to you at your visit today.  Please pick up your prep supplies at the pharmacy within the next 1-3 days. If you use inhalers (even only as needed), please bring them with you on the day of your procedure. Your physician has requested that you go to www.startemmi.com and enter the access code given to you at your visit today. This web site gives a general overview about your procedure. However, you should still follow specific instructions given to you by our office regarding your preparation for the procedure.  Thank you for choosing [facility]  Dr. Scarlette Shorts

## 2016-01-10 ENCOUNTER — Telehealth: Payer: Self-pay | Admitting: Internal Medicine

## 2016-01-10 ENCOUNTER — Encounter: Payer: Self-pay | Admitting: Internal Medicine

## 2016-01-10 MED ORDER — NA SULFATE-K SULFATE-MG SULF 17.5-3.13-1.6 GM/177ML PO SOLN: ORAL | Status: DC

## 2016-01-10 NOTE — Telephone Encounter (Signed)
Rx sent 

## 2016-01-10 NOTE — Telephone Encounter (Signed)
error 

## 2016-01-16 DIAGNOSIS — L570 Actinic keratosis: Secondary | ICD-10-CM | POA: Diagnosis not present

## 2016-01-16 DIAGNOSIS — D045 Carcinoma in situ of skin of trunk: Secondary | ICD-10-CM | POA: Diagnosis not present

## 2016-01-23 ENCOUNTER — Ambulatory Visit (AMBULATORY_SURGERY_CENTER): Payer: Commercial Managed Care - HMO | Admitting: Internal Medicine

## 2016-01-23 ENCOUNTER — Encounter: Payer: Self-pay | Admitting: Internal Medicine

## 2016-01-23 VITALS — BP 175/85 | HR 63 | Resp 15

## 2016-01-23 DIAGNOSIS — D124 Benign neoplasm of descending colon: Secondary | ICD-10-CM

## 2016-01-23 DIAGNOSIS — Z86010 Personal history of colonic polyps: Secondary | ICD-10-CM | POA: Diagnosis not present

## 2016-01-23 DIAGNOSIS — K222 Esophageal obstruction: Secondary | ICD-10-CM

## 2016-01-23 DIAGNOSIS — D509 Iron deficiency anemia, unspecified: Secondary | ICD-10-CM | POA: Diagnosis not present

## 2016-01-23 DIAGNOSIS — D122 Benign neoplasm of ascending colon: Secondary | ICD-10-CM

## 2016-01-23 DIAGNOSIS — I10 Essential (primary) hypertension: Secondary | ICD-10-CM | POA: Diagnosis not present

## 2016-01-23 DIAGNOSIS — E119 Type 2 diabetes mellitus without complications: Secondary | ICD-10-CM | POA: Diagnosis not present

## 2016-01-23 DIAGNOSIS — D649 Anemia, unspecified: Secondary | ICD-10-CM | POA: Diagnosis not present

## 2016-01-23 LAB — GLUCOSE, CAPILLARY
GLUCOSE-CAPILLARY: 109 mg/dL — AB (ref 65–99)
Glucose-Capillary: 86 mg/dL (ref 65–99)

## 2016-01-23 MED ORDER — FERROUS SULFATE 325 (65 FE) MG PO TBEC: 325.0000 mg | DELAYED_RELEASE_TABLET | Freq: Two times a day (BID) | ORAL | Status: AC

## 2016-01-23 MED ORDER — SODIUM CHLORIDE 0.9 % IV SOLN: 500.0000 mL | INTRAVENOUS | Status: DC

## 2016-01-23 NOTE — Progress Notes (Signed)
Called to room to assist during endoscopic procedure.  Patient ID and intended procedure confirmed with present staff. Received instructions for my participation in the procedure from the performing physician.  

## 2016-01-23 NOTE — Patient Instructions (Signed)

## 2016-01-23 NOTE — Progress Notes (Signed)
Patient awakening,vss,report to rn 

## 2016-01-23 NOTE — Op Note (Signed)
[place] [facility] Patient Name: [name] Procedure Date: 01/23/2016 4:08 PM MRN: [id number] Endoscopist: Docia Chuck. Henrene Pastor , MD Age: 75 Date of Birth: 1941/01/13 Gender: Female Procedure:                Upper GI endoscopy, with biopsies Indications:              Iron deficiency anemia Medicines:                Monitored Anesthesia Care Procedure:                Pre-Anesthesia Assessment:                           - Prior to the procedure, a History and Physical                            was performed, and patient medications and                            allergies were reviewed. The patient's tolerance of                            previous anesthesia was also reviewed. The risks                            and benefits of the procedure and the sedation                            options and risks were discussed with the patient.                            All questions were answered, and informed consent                            was obtained. Prior Anticoagulants: The patient has                            taken no previous anticoagulant or antiplatelet                            agents. ASA Grade Assessment: II - A patient with                            mild systemic disease. After reviewing the risks                            and benefits, the patient was deemed in                            satisfactory condition to undergo the procedure.                           After obtaining informed consent, the endoscope was  passed under direct vision. Throughout the                            procedure, the patient's blood pressure, pulse, and                            oxygen saturations were monitored continuously. The                            Model GIF-HQ190 [id number]) scope was introduced                            through the mouth, and advanced to the third part                            of duodenum. The upper GI endoscopy was             accomplished without difficulty. The patient                            tolerated the procedure well. Scope In: Scope Out: Findings:                 A widely patent Schatzki ring (acquired) was found                            at the gastroesophageal junction. The esophagus was                            otherwise normal.                           Multiple 2 to 10 mm pedunculated polyps with no                            stigmata of recent bleeding were found in the                            gastric fundus and body. These were consistent with                            benign fundic gland polyps.                           The exam of the stomach was otherwise normal.                           The cardia and gastric fundus were normal on                            retroflexion.                           The examined duodenum was normal. Biopsies for  histology were taken with a cold forceps for                            evaluation of celiac disease. Complications:            No immediate complications. Estimated Blood Loss:     Estimated blood loss: none. Impression:               - Widely patent Schatzki ring.                           - Multiple gastric polyps.                           - Normal examined duodenum. Biopsied. Recommendation:           - 1. Continue current medications                           2. Follow-up biopsies                           3. Recommend iron sulfate 325 mg twice daily; #60;                            11 refills                           4. Repeat CBC and iron studies 4 weeks                           5. GI office follow-up in 8 weeks with Dr. Henrene Pastor. Docia Chuck. Henrene Pastor, MD 01/23/2016 5:08:01 PM This report has been signed electronically. CC Letter to:             Mark A. Perini, MD

## 2016-01-23 NOTE — Op Note (Signed)
[place] Patient Name: [name] Procedure Date: 01/23/2016 4:08 PM MRN: [id number] Endoscopist: Docia Chuck. Henrene Pastor , MD Age: 75 Date of Birth: 07/24/1941 Gender: Female Procedure:                Colonoscopy, with cold snare polypectomy X2 Indications:              Iron deficiency anemia. 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 The patient's tolerance of                            previous anesthesia was also reviewed. The risks                            and benefits of the procedure and the sedation                            options and risks were discussed with the patient.                            All questions were answered, and informed consent                            was obtained. Prior Anticoagulants: The patient has                            taken no previous anticoagulant or antiplatelet                            agents. ASA Grade Assessment: II - A patient with                            mild systemic disease. After reviewing the risks                            and benefits, the patient was deemed in                            satisfactory condition to undergo the procedure.  After obtaining informed consent, the colonoscope                            was passed under direct vision. Throughout the                            procedure, the patient's blood pressure, pulse, and                            oxygen saturations were monitored continuously. The                            Model CF-HQ190L [id number]) scope was introduced                            through the anus and  advanced to the the cecum,                            identified by appendiceal orifice and ileocecal                            valve. The ileocecal valve, appendiceal orifice,                            and rectum were photographed. The quality of the                            bowel preparation was excellent. The colonoscopy                            was performed without difficulty. The patient                            tolerated the procedure well. The bowel preparation                            used was SUPREP. Scope In: 4:30:05 PM Scope Out: 4:46:48 PM Scope Withdrawal Time: 0 hours 11 minutes 20 seconds  Total Procedure Duration: 0 hours 16 minutes 43 seconds  Findings:                 Two polyps were found in the descending colon and                            ascending colon. The polyps were 4 mm in size.                            These polyps were removed with a cold snare.                            Resection and retrieval were complete.                           Internal hemorrhoids were found during retroflexion.  The exam was otherwise without abnormality on                            direct and retroflexion views. The appendiceal                            orifice was inverted.                           Multiple medium-mouthed diverticula were found in                            the sigmoid colon. Complications:            No immediate complications. Estimated blood loss:                            None. Estimated Blood Loss:     Estimated blood loss: none. Impression:               - Two 4 mm polyps in the descending colon and in                            the ascending colon, removed with a cold snare.                            Resected and retrieved.                           - Internal hemorrhoids.                           - The examination was otherwise normal on direct                            and retroflexion views.                            - Diverticulosis in the sigmoid colon. Recommendation:           - Repeat colonoscopy in 5 years for surveillance.                           - EGD today. Please see report.                           - Continue present medications.                           - Await pathology results. Docia Chuck. Henrene Pastor, MD 01/23/2016 5:00:03 PM This report has been signed electronically. CC Letter to:             Mark A. Perini, MD

## 2016-01-24 ENCOUNTER — Other Ambulatory Visit: Payer: Self-pay

## 2016-01-24 ENCOUNTER — Telehealth: Payer: Self-pay

## 2016-01-24 DIAGNOSIS — D509 Iron deficiency anemia, unspecified: Secondary | ICD-10-CM

## 2016-01-24 NOTE — Telephone Encounter (Signed)
  Follow up Call-  Call back number 01/23/2016  Post procedure Call Back phone  # [phone]  Permission to leave phone message Yes    Patient was called for follow up after her procedure on 01/23/2016. Patient reports that she did pass a little bit of bright red blood after returning home yesterday. When questioned on how much blood, she said that it was on a pad that she was wearing so she wasn't sure how much.  I instructed the patient to call us today if she continued to see blood and to try to determine how much blood she was passing. The patient said that she went through the night without seeing anymore blood. Patient agreed that she would call us today if she continued to see blood. Patient reports that she has had no other problems.    Patient questions:  Do you have a fever, pain , or abdominal swelling? No. Pain Score  0 *  Have you tolerated food without any problems? Yes.    Have you been able to return to your normal activities? Yes.    Do you have any questions about your discharge instructions: Diet   No. Medications  No. Follow up visit  No.  Do you have questions or concerns about your Care? No.  Actions: * If pain score is 4 or above: No action needed, pain <4.

## 2016-01-30 ENCOUNTER — Encounter: Payer: Self-pay | Admitting: Internal Medicine

## 2016-01-30 DIAGNOSIS — D045 Carcinoma in situ of skin of trunk: Secondary | ICD-10-CM | POA: Diagnosis not present

## 2016-02-19 ENCOUNTER — Telehealth: Payer: Self-pay

## 2016-02-19 NOTE — Telephone Encounter (Signed)
Pt aware.

## 2016-02-19 NOTE — Telephone Encounter (Signed)
Left message for pt to call back  °

## 2016-02-19 NOTE — Telephone Encounter (Signed)
-----   Message from Algernon Huxley, RN sent at 01/24/2016  9:01 AM EDT ----- Regarding: Labs Pt needs labs, orders in epic.

## 2016-02-26 DIAGNOSIS — D649 Anemia, unspecified: Secondary | ICD-10-CM | POA: Diagnosis not present

## 2016-03-18 ENCOUNTER — Ambulatory Visit: Payer: Commercial Managed Care - HMO | Admitting: Internal Medicine

## 2016-04-28 DIAGNOSIS — M810 Age-related osteoporosis without current pathological fracture: Secondary | ICD-10-CM | POA: Diagnosis not present

## 2016-04-28 DIAGNOSIS — Z1389 Encounter for screening for other disorder: Secondary | ICD-10-CM | POA: Diagnosis not present

## 2016-04-28 DIAGNOSIS — Z6835 Body mass index (BMI) 35.0-35.9, adult: Secondary | ICD-10-CM | POA: Diagnosis not present

## 2016-04-28 DIAGNOSIS — I10 Essential (primary) hypertension: Secondary | ICD-10-CM | POA: Diagnosis not present

## 2016-04-28 DIAGNOSIS — E1149 Type 2 diabetes mellitus with other diabetic neurological complication: Secondary | ICD-10-CM | POA: Diagnosis not present

## 2016-04-28 DIAGNOSIS — D6489 Other specified anemias: Secondary | ICD-10-CM | POA: Diagnosis not present

## 2016-10-27 DIAGNOSIS — R8299 Other abnormal findings in urine: Secondary | ICD-10-CM | POA: Diagnosis not present

## 2016-10-27 DIAGNOSIS — M810 Age-related osteoporosis without current pathological fracture: Secondary | ICD-10-CM | POA: Diagnosis not present

## 2016-10-27 DIAGNOSIS — I10 Essential (primary) hypertension: Secondary | ICD-10-CM | POA: Diagnosis not present

## 2016-10-27 DIAGNOSIS — E042 Nontoxic multinodular goiter: Secondary | ICD-10-CM | POA: Diagnosis not present

## 2016-10-27 DIAGNOSIS — E784 Other hyperlipidemia: Secondary | ICD-10-CM | POA: Diagnosis not present

## 2016-10-27 DIAGNOSIS — E1149 Type 2 diabetes mellitus with other diabetic neurological complication: Secondary | ICD-10-CM | POA: Diagnosis not present

## 2016-11-03 DIAGNOSIS — G6289 Other specified polyneuropathies: Secondary | ICD-10-CM | POA: Diagnosis not present

## 2016-11-03 DIAGNOSIS — M86179 Other acute osteomyelitis, unspecified ankle and foot: Secondary | ICD-10-CM | POA: Diagnosis not present

## 2016-11-03 DIAGNOSIS — D126 Benign neoplasm of colon, unspecified: Secondary | ICD-10-CM | POA: Diagnosis not present

## 2016-11-03 DIAGNOSIS — L989 Disorder of the skin and subcutaneous tissue, unspecified: Secondary | ICD-10-CM | POA: Diagnosis not present

## 2016-11-03 DIAGNOSIS — Z1231 Encounter for screening mammogram for malignant neoplasm of breast: Secondary | ICD-10-CM | POA: Diagnosis not present

## 2016-11-03 DIAGNOSIS — E1149 Type 2 diabetes mellitus with other diabetic neurological complication: Secondary | ICD-10-CM | POA: Diagnosis not present

## 2016-11-03 DIAGNOSIS — Z0000 Encounter for general adult medical examination without abnormal findings: Secondary | ICD-10-CM | POA: Diagnosis not present

## 2016-11-03 DIAGNOSIS — R490 Dysphonia: Secondary | ICD-10-CM | POA: Diagnosis not present

## 2016-11-03 DIAGNOSIS — I350 Nonrheumatic aortic (valve) stenosis: Secondary | ICD-10-CM | POA: Diagnosis not present

## 2016-11-03 DIAGNOSIS — D649 Anemia, unspecified: Secondary | ICD-10-CM | POA: Diagnosis not present

## 2016-11-05 ENCOUNTER — Telehealth (HOSPITAL_COMMUNITY): Payer: Self-pay | Admitting: Internal Medicine

## 2016-11-05 ENCOUNTER — Other Ambulatory Visit: Payer: Self-pay | Admitting: Internal Medicine

## 2016-11-05 DIAGNOSIS — I350 Nonrheumatic aortic (valve) stenosis: Secondary | ICD-10-CM

## 2016-11-06 NOTE — Telephone Encounter (Signed)
11/05/16 Called pt and lmsg for her to CB to get schedule for echo.

## 2016-11-18 ENCOUNTER — Encounter (HOSPITAL_COMMUNITY): Payer: Self-pay

## 2016-11-18 DIAGNOSIS — H26491 Other secondary cataract, right eye: Secondary | ICD-10-CM | POA: Diagnosis not present

## 2016-11-18 DIAGNOSIS — H26493 Other secondary cataract, bilateral: Secondary | ICD-10-CM | POA: Diagnosis not present

## 2016-11-20 DIAGNOSIS — Z1212 Encounter for screening for malignant neoplasm of rectum: Secondary | ICD-10-CM | POA: Diagnosis not present

## 2016-11-21 ENCOUNTER — Other Ambulatory Visit (HOSPITAL_COMMUNITY): Payer: Self-pay | Admitting: *Deleted

## 2016-11-21 ENCOUNTER — Ambulatory Visit (HOSPITAL_COMMUNITY): Payer: Medicare HMO | Attending: Cardiology

## 2016-11-21 ENCOUNTER — Other Ambulatory Visit: Payer: Self-pay

## 2016-11-21 DIAGNOSIS — I501 Left ventricular failure: Secondary | ICD-10-CM | POA: Insufficient documentation

## 2016-11-21 DIAGNOSIS — I361 Nonrheumatic tricuspid (valve) insufficiency: Secondary | ICD-10-CM | POA: Diagnosis not present

## 2016-11-21 DIAGNOSIS — I313 Pericardial effusion (noninflammatory): Secondary | ICD-10-CM | POA: Insufficient documentation

## 2016-11-21 DIAGNOSIS — I350 Nonrheumatic aortic (valve) stenosis: Secondary | ICD-10-CM | POA: Diagnosis not present

## 2016-11-21 DIAGNOSIS — I340 Nonrheumatic mitral (valve) insufficiency: Secondary | ICD-10-CM | POA: Insufficient documentation

## 2016-11-24 ENCOUNTER — Encounter (HOSPITAL_COMMUNITY)
Admission: RE | Admit: 2016-11-24 | Discharge: 2016-11-24 | Disposition: A | Discharge status: Home / Self Care | Payer: Medicare HMO | Source: Ambulatory Visit | Attending: Internal Medicine | Admitting: Internal Medicine

## 2016-11-24 DIAGNOSIS — D649 Anemia, unspecified: Secondary | ICD-10-CM | POA: Insufficient documentation

## 2016-11-24 MED ORDER — SODIUM CHLORIDE 0.9 % IV SOLN
510.0000 mg | INTRAVENOUS | Status: DC
  Administered 2016-11-24: 510 mg via INTRAVENOUS
  Filled 2016-11-24: qty 17

## 2016-11-24 NOTE — Discharge Instructions (Signed)
Ferumoxytol injection What is this medicine? FERUMOXYTOL is an iron complex. Iron is used to make healthy red blood cells, which carry oxygen and nutrients throughout the body. This medicine is used to treat iron deficiency anemia in people with chronic kidney disease. COMMON BRAND NAME(S): Feraheme What should I tell my health care provider before I take this medicine? They need to know if you have any of these conditions: -anemia not caused by low iron levels -high levels of iron in the blood -magnetic resonance imaging (MRI) test scheduled -an unusual or allergic reaction to iron, other medicines, foods, dyes, or preservatives -pregnant or trying to get pregnant -breast-feeding How should I use this medicine? This medicine is for injection into a vein. It is given by a health care professional in a hospital or clinic setting. Talk to your pediatrician regarding the use of this medicine in children. Special care may be needed. What if I miss a dose? It is important not to miss your dose. Call your doctor or health care professional if you are unable to keep an appointment. What may interact with this medicine? This medicine may interact with the following medications: -other iron products What should I watch for while using this medicine? Visit your doctor or healthcare professional regularly. Tell your doctor or healthcare professional if your symptoms do not start to get better or if they get worse. You may need blood work done while you are taking this medicine. You may need to follow a special diet. Talk to your doctor. Foods that contain iron include: whole grains/cereals, dried fruits, beans, or peas, leafy green vegetables, and organ meats (liver, kidney). What side effects may I notice from receiving this medicine? Side effects that you should report to your doctor or health care professional as soon as possible: -allergic reactions like skin rash, itching or hives, swelling of the  face, lips, or tongue -breathing problems -changes in blood pressure -feeling faint or lightheaded, falls -fever or chills -flushing, sweating, or hot feelings -swelling of the ankles or feet Side effects that usually do not require medical attention (report to your doctor or health care professional if they continue or are bothersome): -diarrhea -headache -nausea, vomiting -stomach pain Where should I keep my medicine? This drug is given in a hospital or clinic and will not be stored at home.  2017 Elsevier/Gold Standard (2015-10-25 12:41:49)  

## 2016-12-01 ENCOUNTER — Encounter (HOSPITAL_COMMUNITY)
Admission: RE | Admit: 2016-12-01 | Discharge: 2016-12-01 | Disposition: A | Discharge status: Home / Self Care | Payer: Medicare HMO | Source: Ambulatory Visit | Attending: Internal Medicine | Admitting: Internal Medicine

## 2016-12-01 DIAGNOSIS — D649 Anemia, unspecified: Secondary | ICD-10-CM | POA: Insufficient documentation

## 2016-12-01 MED ORDER — SODIUM CHLORIDE 0.9 % IV SOLN
510.0000 mg | INTRAVENOUS | Status: AC
  Administered 2016-12-01: 510 mg via INTRAVENOUS
  Filled 2016-12-01: qty 17

## 2016-12-02 DIAGNOSIS — H26492 Other secondary cataract, left eye: Secondary | ICD-10-CM | POA: Diagnosis not present

## 2021-02-07 ENCOUNTER — Encounter: Payer: Self-pay | Admitting: Internal Medicine

## 2024-04-05 DEATH — deceased
